# Patient Record
Sex: Male | Born: 1980
Health system: Southern US, Community
[De-identification: ages and names within clinical notes are randomized; demographics above are authoritative.]

## PROBLEM LIST (undated history)

## (undated) DIAGNOSIS — N289 Disorder of kidney and ureter, unspecified: Secondary | ICD-10-CM

## (undated) DIAGNOSIS — K219 Gastro-esophageal reflux disease without esophagitis: Secondary | ICD-10-CM

## (undated) DIAGNOSIS — Z9484 Stem cells transplant status: Secondary | ICD-10-CM

## (undated) DIAGNOSIS — C8190 Hodgkin lymphoma, unspecified, unspecified site: Secondary | ICD-10-CM

## (undated) DIAGNOSIS — J302 Other seasonal allergic rhinitis: Secondary | ICD-10-CM

## (undated) HISTORY — DX: Gastro-esophageal reflux disease without esophagitis: K21.9

## (undated) HISTORY — DX: Other seasonal allergic rhinitis: J30.2

## (undated) HISTORY — PX: LIMBAL STEM CELL TRANSPLANT: SHX1969

## (undated) HISTORY — DX: Disorder of kidney and ureter, unspecified: N28.9

## (undated) HISTORY — DX: Hodgkin lymphoma, unspecified, unspecified site: C81.90

## (undated) HISTORY — DX: Stem cells transplant status: Z94.84

---

## 2003-09-20 ENCOUNTER — Other Ambulatory Visit: Admission: RE | Admit: 2003-09-20 | Discharge: 2003-09-20 | Payer: Self-pay | Admitting: Vascular Surgery

## 2003-10-08 ENCOUNTER — Ambulatory Visit (HOSPITAL_COMMUNITY): Admission: RE | Admit: 2003-10-08 | Discharge: 2003-10-08 | Payer: Self-pay | Admitting: Oncology

## 2003-12-08 ENCOUNTER — Ambulatory Visit: Payer: Self-pay | Admitting: Oncology

## 2004-02-01 ENCOUNTER — Ambulatory Visit: Payer: Self-pay | Admitting: Oncology

## 2004-04-10 ENCOUNTER — Ambulatory Visit: Payer: Self-pay | Admitting: Oncology

## 2004-04-14 ENCOUNTER — Ambulatory Visit (HOSPITAL_COMMUNITY): Admission: RE | Admit: 2004-04-14 | Discharge: 2004-04-14 | Payer: Self-pay | Admitting: Oncology

## 2004-07-03 ENCOUNTER — Ambulatory Visit: Payer: Self-pay | Admitting: Oncology

## 2004-11-29 ENCOUNTER — Ambulatory Visit: Payer: Self-pay | Admitting: Oncology

## 2005-02-21 ENCOUNTER — Ambulatory Visit: Payer: Self-pay | Admitting: Oncology

## 2005-05-24 ENCOUNTER — Ambulatory Visit: Payer: Self-pay | Admitting: Oncology

## 2005-08-16 ENCOUNTER — Ambulatory Visit: Payer: Self-pay | Admitting: Oncology

## 2010-12-28 DIAGNOSIS — Z9484 Stem cells transplant status: Secondary | ICD-10-CM | POA: Insufficient documentation

## 2012-12-11 DIAGNOSIS — C8190 Hodgkin lymphoma, unspecified, unspecified site: Secondary | ICD-10-CM | POA: Insufficient documentation

## 2017-07-31 ENCOUNTER — Encounter: Payer: Self-pay | Admitting: Family Medicine

## 2017-07-31 ENCOUNTER — Ambulatory Visit (INDEPENDENT_AMBULATORY_CARE_PROVIDER_SITE_OTHER): Payer: 59 | Admitting: Family Medicine

## 2017-07-31 VITALS — BP 110/82 | HR 72 | Temp 98.2°F | Wt 192.2 lb

## 2017-07-31 DIAGNOSIS — Z13220 Encounter for screening for lipoid disorders: Secondary | ICD-10-CM | POA: Diagnosis not present

## 2017-07-31 DIAGNOSIS — F17210 Nicotine dependence, cigarettes, uncomplicated: Secondary | ICD-10-CM | POA: Diagnosis not present

## 2017-07-31 DIAGNOSIS — Z7251 High risk heterosexual behavior: Secondary | ICD-10-CM

## 2017-07-31 DIAGNOSIS — Z0000 Encounter for general adult medical examination without abnormal findings: Secondary | ICD-10-CM | POA: Diagnosis not present

## 2017-07-31 DIAGNOSIS — C8190 Hodgkin lymphoma, unspecified, unspecified site: Secondary | ICD-10-CM | POA: Diagnosis not present

## 2017-07-31 LAB — LIPID PANEL
CHOLESTEROL: 190 mg/dL (ref 0–200)
HDL: 38.9 mg/dL — ABNORMAL LOW (ref >39.00)
LDL Cholesterol: 131 mg/dL — ABNORMAL HIGH (ref 0–99)
NonHDL: 150.62
TRIGLYCERIDES: 99 mg/dL (ref 0.0–149.0)
Total CHOL/HDL Ratio: 5
VLDL: 19.8 mg/dL (ref 0.0–40.0)

## 2017-07-31 LAB — COMPREHENSIVE METABOLIC PANEL
ALBUMIN: 4.6 g/dL (ref 3.5–5.2)
ALK PHOS: 38 U/L — AB (ref 39–117)
ALT: 15 U/L (ref 0–53)
AST: 16 U/L (ref 0–37)
BILIRUBIN TOTAL: 0.8 mg/dL (ref 0.2–1.2)
BUN: 20 mg/dL (ref 6–23)
CALCIUM: 10.3 mg/dL (ref 8.4–10.5)
CO2: 28 mEq/L (ref 19–32)
Chloride: 104 mEq/L (ref 96–112)
Creatinine, Ser: 1.21 mg/dL (ref 0.40–1.50)
GFR: 71.02 mL/min (ref >60.00)
Glucose, Bld: 96 mg/dL (ref 70–99)
Potassium: 3.9 mEq/L (ref 3.5–5.1)
Sodium: 138 mEq/L (ref 135–145)
TOTAL PROTEIN: 6.6 g/dL (ref 6.0–8.3)

## 2017-07-31 LAB — CBC WITH DIFFERENTIAL/PLATELET
BASOS ABS: 0 10*3/uL (ref 0.0–0.1)
Basophils Relative: 0.3 % (ref 0.0–3.0)
EOS ABS: 0.1 10*3/uL (ref 0.0–0.7)
Eosinophils Relative: 1.1 % (ref 0.0–5.0)
HEMATOCRIT: 41.9 % (ref 39.0–52.0)
HEMOGLOBIN: 14.3 g/dL (ref 13.0–17.0)
LYMPHS PCT: 19.5 % (ref 12.0–46.0)
Lymphs Abs: 0.9 10*3/uL (ref 0.7–4.0)
MCHC: 34 g/dL (ref 30.0–36.0)
MCV: 89 fl (ref 78.0–100.0)
Monocytes Absolute: 0.4 10*3/uL (ref 0.1–1.0)
Monocytes Relative: 9.3 % (ref 3.0–12.0)
Neutro Abs: 3.2 10*3/uL (ref 1.4–7.7)
Neutrophils Relative %: 69.8 % (ref 43.0–77.0)
Platelets: 212 10*3/uL (ref 150.0–400.0)
RBC: 4.72 Mil/uL (ref 4.22–5.81)
RDW: 13.1 % (ref 11.5–15.5)
WBC: 4.6 10*3/uL (ref 4.0–10.5)

## 2017-07-31 LAB — TSH: TSH: 1.89 u[IU]/mL (ref 0.35–4.50)

## 2017-07-31 MED ORDER — FLUTICASONE PROPIONATE 50 MCG/ACT NA SUSP: 2.0000 | Freq: Every day | NASAL | 6 refills | Status: DC

## 2017-07-31 MED ORDER — BUPROPION HCL ER (SR) 150 MG PO TB12: 150.0000 mg | ORAL_TABLET | Freq: Two times a day (BID) | ORAL | 1 refills | Status: DC

## 2017-07-31 MED ORDER — RANITIDINE HCL 150 MG PO TABS: 150.0000 mg | ORAL_TABLET | Freq: Two times a day (BID) | ORAL | 2 refills | Status: DC

## 2017-07-31 MED ORDER — FEXOFENADINE HCL 180 MG PO TABS: 180.0000 mg | ORAL_TABLET | Freq: Every day | ORAL | 2 refills | Status: DC

## 2017-07-31 MED FILL — BUPROPION SR 150 MG TABLET: 150 | 30 days supply | Qty: 60 | Fill #0

## 2017-07-31 MED FILL — raNITIdine HCL 150 MG TABS: 150 | 90 days supply | Qty: 180 | Fill #0

## 2017-07-31 MED FILL — FLUTICASONE PROP 50 MCG SPR: 50 | 30 days supply | Qty: 16 | Fill #0

## 2017-07-31 NOTE — Patient Instructions (Signed)

## 2017-07-31 NOTE — Assessment & Plan Note (Signed)
>  10 minutes spent counseling regarding smoking cessation Recommend quitting Would like to try medication to help with quitting, rx for bupropion.

## 2017-07-31 NOTE — Assessment & Plan Note (Signed)
Well adult Orders Placed This Encounter  Procedures  . GC/Chlamydia Probe Amp([facility])    Order Specific Question:   Source    Answer:   urine  . Comp Met (CMET)  . CBC w/Diff  . TSH  . Lipid Profile  . HIV antibody (with reflex)  . RPR   Screening:  STD screening ordered Immunizations: Up to date Anticipatory guidance/Risk factor reduction:  Per AVS

## 2017-07-31 NOTE — Assessment & Plan Note (Addendum)
Follows with [facility] yearly Tender area to R axilla, does not appear to be enlarged lymph node and improved from when he first noticed.  He will monitor.

## 2017-07-31 NOTE — Progress Notes (Signed)
NUMA SCHROETER - 37 y.o. male MRN [id number]  Date of birth: [birth year]  Subjective Chief Complaint  Patient presents with  . Establish Care    Est care,CPE with fasting    HPI DAWON TROOP is a 36 y.o. male with history of hodgkins lymphoma s/p stem cell transplant now in remission here today to establish care and for CPE.  He follows yearly with [facility] for history of lymphoma.  He reports he is doing well.  Would like to have fasting labs today as well as STD screening.  He remains active and follows a fairly healthy diet.  He is taking OTC medications for allergies and GERD.   Current daily smoker, interested in quitting.  Has tried patches, gum and lozenges without much success.   Review of Systems  Constitutional: Negative for chills, fever, malaise/fatigue and weight loss.  HENT: Negative for congestion, ear pain and sore throat.   Eyes: Negative for blurred vision, double vision and pain.  Respiratory: Negative for cough and shortness of breath.   Cardiovascular: Negative for chest pain and palpitations.  Gastrointestinal: Negative for abdominal pain, blood in stool, constipation, heartburn and nausea.  Genitourinary: Negative for dysuria and urgency.  Musculoskeletal: Negative for joint pain and myalgias.  Skin: Negative for itching and rash.  Neurological: Negative for dizziness and headaches.  Endo/Heme/Allergies: Does not bruise/bleed easily.       Painful enlarged area R underarm,?lymph node   Psychiatric/Behavioral: Negative for depression. The patient is not nervous/anxious and does not have insomnia.     No Known Allergies  Past Medical History:  Diagnosis Date  . GERD (gastroesophageal reflux disease)   . Hodgkin's lymphoma ([place])   . Seasonal allergies     Past Surgical History:  Procedure Laterality Date  . LIMBAL STEM CELL TRANSPLANT      Social History   Socioeconomic History  . Marital status: Married    Spouse name: Not on file  . Number  of children: Not on file  . Years of education: Not on file  . Highest education level: Not on file  Occupational History  . Not on file  Social Needs  . Financial resource strain: Not on file  . Food insecurity:    Worry: Not on file    Inability: Not on file  . Transportation needs:    Medical: Not on file    Non-medical: Not on file  Tobacco Use  . Smoking status: Current Every Day Smoker    Packs/day: 1.00    Types: Cigarettes  . Smokeless tobacco: Never Used  Substance and Sexual Activity  . Alcohol use: Yes    Comment: occasional  . Drug use: Never  . Sexual activity: Yes  Lifestyle  . Physical activity:    Days per week: Not on file    Minutes per session: Not on file  . Stress: Not on file  Relationships  . Social connections:    Talks on phone: Not on file    Gets together: Not on file    Attends religious service: Not on file    Active member of club or organization: Not on file    Attends meetings of clubs or organizations: Not on file    Relationship status: Not on file  Other Topics Concern  . Not on file  Social History Narrative  . Not on file    History reviewed. No pertinent family history.  Health Maintenance  Topic Date Due  . HIV Screening  09/30/1993  . INFLUENZA VACCINE  08/29/2017  . TETANUS/TDAP  12/09/2021    ----------------------------------------------------------------------------------------------------------------------------------------------------------------------------------------------------------------- Physical Exam BP 110/82 (BP Location: Left Arm, Patient Position: Sitting, Cuff Size: Normal)   Pulse 72   Temp 98.2 F (36.8 C) (Oral)   Wt 192 lb 3.2 oz (87.2 kg)   SpO2 96%   Physical Exam  Constitutional: He is oriented to person, place, and time. He appears well-nourished. No distress.  HENT:  Head: Normocephalic and atraumatic.  Mouth/Throat: Oropharynx is clear and moist.  Serous fluid behind R TM L TM  normal.   Eyes: No scleral icterus.  Neck: Neck supple. No thyromegaly present.  Cardiovascular: Normal rate, regular rhythm and normal heart sounds.  Pulmonary/Chest: Effort normal and breath sounds normal.  Abdominal: Soft. Bowel sounds are normal.  Musculoskeletal: He exhibits no edema.  Lymphadenopathy:    He has no cervical adenopathy.       Right: No inguinal and no supraclavicular adenopathy present.       Left: No inguinal and no supraclavicular adenopathy present.  Small palpable area R underarm, tender.    Neurological: He is alert and oriented to person, place, and time. No cranial nerve deficit. Coordination normal.  Skin: Skin is warm and dry. No rash noted.  Psychiatric: He has a normal mood and affect. His behavior is normal.    ------------------------------------------------------------------------------------------------------------------------------------------------------------------------------------------------------------------- Assessment and Plan  Hodgkin's disease ([place]) Follows with [facility] yearly Tender area to R axilla, does not appear to be enlarged lymph node and improved from when he first noticed.  He will monitor.    Well adult exam Well adult Orders Placed This Encounter  Procedures  . GC/Chlamydia Probe Amp([facility])    Order Specific Question:   Source    Answer:   urine  . Comp Met (CMET)  . CBC w/Diff  . TSH  . Lipid Profile  . HIV antibody (with reflex)  . RPR   Screening:  STD screening ordered Immunizations: Up to date Anticipatory guidance/Risk factor reduction:  Per AVS  Nicotine dependence, cigarettes, uncomplicated >93 minutes spent counseling regarding smoking cessation Recommend quitting Would like to try medication to help with quitting, rx for bupropion.

## 2017-08-01 LAB — GC/CHLAMYDIA PROBE AMP
Chlamydia trachomatis, NAA: NEGATIVE
Neisseria gonorrhoeae by PCR: NEGATIVE

## 2017-08-02 ENCOUNTER — Encounter: Payer: Self-pay | Admitting: Family Medicine

## 2017-08-02 LAB — RPR: RPR: NONREACTIVE

## 2017-08-02 LAB — HIV ANTIBODY (ROUTINE TESTING W REFLEX): HIV 1&2 Ab, 4th Generation: NONREACTIVE

## 2017-08-05 NOTE — Telephone Encounter (Signed)
Patient called and is inquring about is lab results.  CB# [phone]

## 2017-08-05 NOTE — Telephone Encounter (Signed)
Result note sent via mychart, some results were delayed (rpr, gc/chlamydia) due to the holiday.

## 2017-08-13 DIAGNOSIS — H52223 Regular astigmatism, bilateral: Secondary | ICD-10-CM | POA: Diagnosis not present

## 2017-08-13 DIAGNOSIS — H5213 Myopia, bilateral: Secondary | ICD-10-CM | POA: Diagnosis not present

## 2017-08-23 DIAGNOSIS — F4322 Adjustment disorder with anxiety: Secondary | ICD-10-CM | POA: Diagnosis not present

## 2017-09-05 ENCOUNTER — Encounter: Payer: Self-pay | Admitting: Family Medicine

## 2017-09-05 ENCOUNTER — Ambulatory Visit: Payer: 59 | Admitting: Family Medicine

## 2017-09-05 VITALS — BP 128/80 | HR 72 | Temp 98.5°F | Ht 73.0 in | Wt 189.4 lb

## 2017-09-05 DIAGNOSIS — R222 Localized swelling, mass and lump, trunk: Secondary | ICD-10-CM | POA: Insufficient documentation

## 2017-09-05 NOTE — Patient Instructions (Signed)
Lipoma A lipoma is a noncancerous (benign) tumor that is made up of fat cells. This is a very common type of soft-tissue growth. Lipomas are usually found under the skin (subcutaneous). They may occur in any tissue of the body that contains fat. Common areas for lipomas to appear include the back, shoulders, buttocks, and thighs. Lipomas grow slowly, and they are usually painless. Most lipomas do not cause problems and do not require treatment. What are the causes? The cause of this condition is not known. What increases the risk? This condition is more likely to develop in:  People who are 40-60 years old.  People who have a family history of lipomas.  What are the signs or symptoms? A lipoma usually appears as a small, round bump under the skin. It may feel soft or rubbery, but the firmness can vary. Most lipomas are not painful. However, a lipoma may become painful if it is located in an area where it pushes on nerves. How is this diagnosed? A lipoma can usually be diagnosed with a physical exam. You may also have tests to confirm the diagnosis and to rule out other conditions. Tests may include:  Imaging tests, such as a CT scan or MRI.  Removal of a tissue sample to be looked at under a microscope (biopsy).  How is this treated? Treatment is not needed for small lipomas that are not causing problems. If a lipoma continues to get bigger or it causes problems, removal is often the best option. Lipomas can also be removed to improve appearance. Removal of a lipoma is usually done with a surgery in which the fatty cells and the surrounding capsule are removed. Most often, a medicine that numbs the area (local anesthetic) is used for this procedure. Follow these instructions at home:  Keep all follow-up visits as directed by your health care provider. This is important. Contact a health care provider if:  Your lipoma becomes larger or hard.  Your lipoma becomes painful, red, or  increasingly swollen. These could be signs of infection or a more serious condition. This information is not intended to replace advice given to you by your health care provider. Make sure you discuss any questions you have with your health care provider. Document Released: 01/05/2002 Document Revised: 06/23/2015 Document Reviewed: 01/11/2014 Elsevier Interactive Patient Education  2018 Elsevier Inc.  

## 2017-09-05 NOTE — Progress Notes (Signed)
Subjective:  Patient ID: Gregory Holmes, male    DOB: [birth year]  Age: 37 y.o. MRN: [id number]  CC: Mass (right side of back)   HPI Gregory Holmes presents for evaluation of a mass that he discovered in his right posterior rib cage a few days ago.  Mass is nontender and has not grown.  Has no history of inclusion cyst.  Significant past medical history of Hoskins lymphoma in remission.  Has no history of lipoma  Outpatient Medications Prior to Visit  Medication Sig Dispense Refill  . fexofenadine (ALLEGRA) 180 MG tablet Take 1 tablet (180 mg total) by mouth daily. 90 tablet 2  . fluticasone (FLONASE) 50 MCG/ACT nasal spray Place 2 sprays into both nostrils daily. 16 g 6  . ranitidine (ZANTAC) 150 MG tablet Take 1 tablet (150 mg total) by mouth 2 (two) times daily. 180 tablet 2  . buPROPion (WELLBUTRIN SR) 150 MG 12 hr tablet Take 1 tablet (150 mg total) by mouth 2 (two) times daily. One daily x3 days then increase to BID 60 tablet 1   No facility-administered medications prior to visit.     ROS Review of Systems  Constitutional: Negative for chills, diaphoresis, fever and unexpected weight change.  Respiratory: Negative.   Cardiovascular: Negative.   Gastrointestinal: Negative.   Skin: Negative for color change, pallor, rash and wound.  Hematological: Negative for adenopathy. Does not bruise/bleed easily.  Psychiatric/Behavioral: Negative.     Objective:  BP 128/80   Pulse 72   Temp 98.5 F (36.9 C)   Ht 6\' 1"  (1.854 m)   Wt 189 lb 6 oz (85.9 kg)   SpO2 97%   BMI 24.99 kg/m   BP Readings from Last 3 Encounters:  09/05/17 128/80  07/31/17 110/82    Wt Readings from Last 3 Encounters:  09/05/17 189 lb 6 oz (85.9 kg)  07/31/17 192 lb 3.2 oz (87.2 kg)    Physical Exam  Constitutional: He is oriented to person, place, and time. He appears well-developed and well-nourished. No distress.  HENT:  Head: Normocephalic and atraumatic.  Right Ear: External ear normal.    Left Ear: External ear normal.  Mouth/Throat: Oropharynx is clear and moist. No oropharyngeal exudate.  Eyes: Pupils are equal, round, and reactive to light. Conjunctivae and EOM are normal. Right eye exhibits no discharge. Left eye exhibits no discharge. No scleral icterus.  Neck: No JVD present. No tracheal deviation present. No thyromegaly present.  Cardiovascular: Normal rate, regular rhythm and normal heart sounds.  Pulmonary/Chest: Effort normal and breath sounds normal.  Lymphadenopathy:    He has no cervical adenopathy.  Neurological: He is alert and oriented to person, place, and time.  Skin: Skin is warm and dry. He is not diaphoretic.     Psychiatric: He has a normal mood and affect. His behavior is normal.    Lab Results  Component Value Date   WBC 4.6 07/31/2017   HGB 14.3 07/31/2017   HCT 41.9 07/31/2017   PLT 212.0 07/31/2017   GLUCOSE 96 07/31/2017   CHOL 190 07/31/2017   TRIG 99.0 07/31/2017   HDL 38.90 (L) 07/31/2017   LDLCALC 131 (H) 07/31/2017   ALT 15 07/31/2017   AST 16 07/31/2017   NA 138 07/31/2017   K 3.9 07/31/2017   CL 104 07/31/2017   CREATININE 1.21 07/31/2017   BUN 20 07/31/2017   CO2 28 07/31/2017   TSH 1.89 07/31/2017    No results found.  Assessment &  Plan:   Tyren was seen today for mass.  Diagnoses and all orders for this visit:  Mass in chest   I have discontinued Mills C. Davisson's buPROPion. I am also having him maintain his fexofenadine, fluticasone, and ranitidine.  No orders of the defined types were placed in this encounter.  Believe that mass in question is likely to be a lipoma.  Explained the nature of these lesions to the patient.  He was given information about lipomas.  Of course he will return if the mass grows becomes tender or inflamed.  Reassurance was offered and that lymph tissue is not found in the area of concern.  Follow-up: Return if symptoms worsen or fail to improve.  Libby Maw, MD

## 2017-09-09 DIAGNOSIS — F4322 Adjustment disorder with anxiety: Secondary | ICD-10-CM | POA: Diagnosis not present

## 2017-10-04 DIAGNOSIS — F4322 Adjustment disorder with anxiety: Secondary | ICD-10-CM | POA: Diagnosis not present

## 2017-12-05 DIAGNOSIS — H9203 Otalgia, bilateral: Secondary | ICD-10-CM | POA: Diagnosis not present

## 2017-12-05 DIAGNOSIS — H903 Sensorineural hearing loss, bilateral: Secondary | ICD-10-CM | POA: Diagnosis not present

## 2017-12-05 DIAGNOSIS — H9313 Tinnitus, bilateral: Secondary | ICD-10-CM | POA: Diagnosis not present

## 2017-12-09 DIAGNOSIS — F4310 Post-traumatic stress disorder, unspecified: Secondary | ICD-10-CM | POA: Diagnosis not present

## 2017-12-09 DIAGNOSIS — H903 Sensorineural hearing loss, bilateral: Secondary | ICD-10-CM | POA: Diagnosis not present

## 2017-12-16 DIAGNOSIS — H903 Sensorineural hearing loss, bilateral: Secondary | ICD-10-CM | POA: Diagnosis not present

## 2017-12-17 DIAGNOSIS — F4310 Post-traumatic stress disorder, unspecified: Secondary | ICD-10-CM | POA: Diagnosis not present

## 2017-12-18 DIAGNOSIS — F4323 Adjustment disorder with mixed anxiety and depressed mood: Secondary | ICD-10-CM | POA: Diagnosis not present

## 2017-12-19 ENCOUNTER — Encounter: Payer: Self-pay | Admitting: Family Medicine

## 2017-12-19 MED FILL — FLUTICASONE PROP 50 MCG SPR: 50 | 30 days supply | Qty: 16 | Fill #0

## 2017-12-20 ENCOUNTER — Other Ambulatory Visit: Payer: Self-pay | Admitting: Family Medicine

## 2017-12-20 ENCOUNTER — Telehealth: Payer: Self-pay

## 2017-12-20 MED ORDER — LEVOCETIRIZINE DIHYDROCHLORIDE 5 MG PO TABS: 5.0000 mg | ORAL_TABLET | Freq: Every evening | ORAL | 6 refills | Status: DC

## 2017-12-20 MED ORDER — FLUTICASONE PROPIONATE 50 MCG/ACT NA SUSP: 2.0000 | Freq: Every day | NASAL | 6 refills | Status: AC

## 2017-12-20 MED ORDER — FAMOTIDINE 20 MG PO TABS: 20.0000 mg | ORAL_TABLET | Freq: Two times a day (BID) | ORAL | 3 refills | Status: DC

## 2017-12-20 MED FILL — FAMOTIDINE 20 MG TABLET: 20 | 30 days supply | Qty: 60 | Fill #0

## 2017-12-20 MED FILL — LEVOCETIRIZINE 5 MG TABLET: 5 | 30 days supply | Qty: 30 | Fill #0

## 2017-12-20 NOTE — Telephone Encounter (Signed)
Medication has been sent to the pharmacy. 

## 2017-12-20 NOTE — Telephone Encounter (Signed)
Rx for xyzal, famotidine and flonase sent in.

## 2017-12-20 NOTE — Telephone Encounter (Signed)
Copied from [place] [id number]. Topic: General - Other >> Dec 19, 2017  5:05 PM Keene Breath wrote: Reason for CRM: Patient called to inform the doctor of a recall for his medication, ranitidine (ZANTAC) 150 MG tablet.  Patient would like an alternative medication.  Please advise and CB at [phone].

## 2017-12-30 ENCOUNTER — Ambulatory Visit: Payer: 59 | Admitting: Family Medicine

## 2017-12-30 ENCOUNTER — Encounter: Payer: Self-pay | Admitting: Family Medicine

## 2017-12-30 VITALS — BP 132/78 | HR 82 | Temp 98.4°F | Ht 73.0 in | Wt 191.0 lb

## 2017-12-30 DIAGNOSIS — F418 Other specified anxiety disorders: Secondary | ICD-10-CM | POA: Diagnosis not present

## 2017-12-30 DIAGNOSIS — F4310 Post-traumatic stress disorder, unspecified: Secondary | ICD-10-CM | POA: Diagnosis not present

## 2017-12-30 MED ORDER — ESCITALOPRAM OXALATE 10 MG PO TABS: 10.0000 mg | ORAL_TABLET | Freq: Every day | ORAL | 1 refills | Status: DC

## 2017-12-30 MED FILL — ESCITALOPRAM 10 MG TABLET: 10 | 30 days supply | Qty: 30 | Fill #0

## 2017-12-30 NOTE — Assessment & Plan Note (Signed)
-  We discussed medication treatment options in addition to his current therapy.  -Will start lexapro 10mg  -Follow up in 2 weeks.   >25 minutes spent with patient with 50% of time spent providing counseling and coordinating plan of care.

## 2017-12-30 NOTE — Progress Notes (Signed)
Gregory Holmes - 37 y.o. male MRN [id number]  Date of birth: 17-Apr-1980  Subjective Chief Complaint  Patient presents with  . Depression    Wife encouraged eval due to ongoing loss of interest, mood changes    HPI Gregory Holmes is a 37 y.o. male here today to discuss depression and anxiety.  Reports symptoms of depressed mood and anxious feeling with anhedonia.  He reports that feelings started several months ago.  Reports having an affair a few months ago and once this came out he began to have increased symptoms.  He reports that affair is over at this point.  He was drinking excess EtOH at one point to deal with symptoms but has now cut back to only 3-4 drinks per week.  He is seeing a individual therapist as well as a marriage Social worker.  This has been somewhat helpful.  He did try wellbutrin to help with quitting smoking previously as well but this didn't seem to do a whole lot for mood or anxiety.  He is interested in trying something else.  He denies SI/HI.   Depression screen [facility] 2/9 12/30/2017 07/31/2017  Decreased Interest 2 0  Down, Depressed, Hopeless 3 0  PHQ - 2 Score 5 0  Altered sleeping 1 -  Tired, decreased energy 3 -  Change in appetite 2 -  Feeling bad or failure about yourself  3 -  Trouble concentrating 2 -  Moving slowly or fidgety/restless 2 -  Suicidal thoughts 0 -  PHQ-9 Score 18 -     GAD 7 : Generalized Anxiety Score 12/30/2017  Nervous, Anxious, on Edge 3  Control/stop worrying 2  Worry too much - different things 2  Trouble relaxing 2  Restless 2  Easily annoyed or irritable 2  Afraid - awful might happen 2  Total GAD 7 Score 15    ROS:  A comprehensive ROS was completed and negative except as noted per HPI  No Known Allergies  Past Medical History:  Diagnosis Date  . GERD (gastroesophageal reflux disease)   . Hodgkin's lymphoma ([place])   . Seasonal allergies     Past Surgical History:  Procedure Laterality Date  . LIMBAL STEM CELL  TRANSPLANT      Social History   Socioeconomic History  . Marital status: Married    Spouse name: Not on file  . Number of children: Not on file  . Years of education: Not on file  . Highest education level: Not on file  Occupational History  . Not on file  Social Needs  . Financial resource strain: Not on file  . Food insecurity:    Worry: Not on file    Inability: Not on file  . Transportation needs:    Medical: Not on file    Non-medical: Not on file  Tobacco Use  . Smoking status: Current Every Day Smoker    Packs/day: 1.00    Types: Cigarettes  . Smokeless tobacco: Never Used  Substance and Sexual Activity  . Alcohol use: Yes    Comment: occasional  . Drug use: Never  . Sexual activity: Yes  Lifestyle  . Physical activity:    Days per week: Not on file    Minutes per session: Not on file  . Stress: Not on file  Relationships  . Social connections:    Talks on phone: Not on file    Gets together: Not on file    Attends religious service: Not on file  Active member of club or organization: Not on file    Attends meetings of clubs or organizations: Not on file    Relationship status: Not on file  Other Topics Concern  . Not on file  Social History Narrative  . Not on file    Family History  Problem Relation Age of Onset  . Diabetes Mother     Health Maintenance  Topic Date Due  . Samul Dada  12/09/2021  . INFLUENZA VACCINE  Completed  . HIV Screening  Completed    ----------------------------------------------------------------------------------------------------------------------------------------------------------------------------------------------------------------- Physical Exam BP 132/78   Pulse 82   Temp 98.4 F (36.9 C) (Oral)   Ht 6\' 1"  (1.854 m)   Wt 191 lb (86.6 kg)   SpO2 98%   BMI 25.20 kg/m   Physical Exam  Constitutional: He is oriented to person, place, and time. He appears well-nourished. No distress.  HENT:  Head:  Normocephalic and atraumatic.  Neurological: He is alert and oriented to person, place, and time.  Skin: Skin is warm and dry.  Psychiatric: He has a normal mood and affect. His behavior is normal. Judgment and thought content normal.    ------------------------------------------------------------------------------------------------------------------------------------------------------------------------------------------------------------------- Assessment and Plan  Depression with anxiety -We discussed medication treatment options in addition to his current therapy.  -Will start lexapro 10mg  -Follow up in 2 weeks.   >25 minutes spent with patient with 50% of time spent providing counseling and coordinating plan of care.

## 2017-12-30 NOTE — Patient Instructions (Signed)
Living With Depression Everyone experiences occasional disappointment, sadness, and loss in their lives. When you are feeling down, blue, or sad for at least 2 weeks in a row, it may mean that you have depression. Depression can affect your thoughts and feelings, relationships, daily activities, and physical health. It is caused by changes in the way your brain functions. If you receive a diagnosis of depression, your health care provider will tell you which type of depression you have and what treatment options are available to you. If you are living with depression, there are ways to help you recover from it and also ways to prevent it from coming back. How to cope with lifestyle changes Coping with stress Stress is your body's reaction to life changes and events, both good and bad. Stressful situations may include:  Getting married.  The death of a spouse.  Losing a job.  Retiring.  Having a baby.  Stress can last just a few hours or it can be ongoing. Stress can play a major role in depression, so it is important to learn both how to cope with stress and how to think about it differently. Talk with your health care provider or a counselor if you would like to learn more about stress reduction. He or she may suggest some stress reduction techniques, such as:  Music therapy. This can include creating music or listening to music. Choose music that you enjoy and that inspires you.  Mindfulness-based meditation. This kind of meditation can be done while sitting or walking. It involves being aware of your normal breaths, rather than trying to control your breathing.  Centering prayer. This is a kind of meditation that involves focusing on a spiritual word or phrase. Choose a word, phrase, or sacred image that is meaningful to you and that brings you peace.  Deep breathing. To do this, expand your stomach and inhale slowly through your nose. Hold your breath for 3-5 seconds, then exhale  slowly, allowing your stomach muscles to relax.  Muscle relaxation. This involves intentionally tensing muscles then relaxing them.  Choose a stress reduction technique that fits your lifestyle and personality. Stress reduction techniques take time and practice to develop. Set aside 5-15 minutes a day to do them. Therapists can offer training in these techniques. The training may be covered by some insurance plans. Other things you can do to manage stress include:  Keeping a stress diary. This can help you learn what triggers your stress and ways to control your response.  Understanding what your limits are and saying no to requests or events that lead to a schedule that is too full.  Thinking about how you respond to certain situations. You may not be able to control everything, but you can control how you react.  Adding humor to your life by watching funny films or TV shows.  Making time for activities that help you relax and not feeling guilty about spending your time this way.  Medicines Your health care provider may suggest certain medicines if he or she feels that they will help improve your condition. Avoid using alcohol and other substances that may prevent your medicines from working properly (may interact). It is also important to:  Talk with your pharmacist or health care provider about all the medicines that you take, their possible side effects, and what medicines are safe to take together.  Make it your goal to take part in all treatment decisions (shared decision-making). This includes giving input on the side   effects of medicines. It is best if shared decision-making with your health care provider is part of your total treatment plan.  If your health care provider prescribes a medicine, you may not notice the full benefits of it for 4-8 weeks. Most people who are treated for depression need to be on medicine for at least 6-12 months after they feel better. If you are taking  medicines as part of your treatment, do not stop taking medicines without first talking to your health care provider. You may need to have the medicine slowly decreased (tapered) over time to decrease the risk of harmful side effects. Relationships Your health care provider may suggest family therapy along with individual therapy and drug therapy. While there may not be family problems that are causing you to feel depressed, it is still important to make sure your family learns as much as they can about your mental health. Having your family's support can help make your treatment successful. How to recognize changes in your condition Everyone has a different response to treatment for depression. Recovery from major depression happens when you have not had signs of major depression for two months. This may mean that you will start to:  Have more interest in doing activities.  Feel less hopeless than you did 2 months ago.  Have more energy.  Overeat less often, or have better or improving appetite.  Have better concentration.  Your health care provider will work with you to decide the next steps in your recovery. It is also important to recognize when your condition is getting worse. Watch for these signs:  Having fatigue or low energy.  Eating too much or too little.  Sleeping too much or too little.  Feeling restless, agitated, or hopeless.  Having trouble concentrating or making decisions.  Having unexplained physical complaints.  Feeling irritable, angry, or aggressive.  Get help as soon as you or your family members notice these symptoms coming back. How to get support and help from others How to talk with friends and family members about your condition Talking to friends and family members about your condition can provide you with one way to get support and guidance. Reach out to trusted friends or family members, explain your symptoms to them, and let them know that you are  working with a health care provider to treat your depression. Financial resources Not all insurance plans cover mental health care, so it is important to check with your insurance carrier. If paying for co-pays or counseling services is a problem, search for a local or county mental health care center. They may be able to offer public mental health care services at low or no cost when you are not able to see a private health care provider. If you are taking medicine for depression, you may be able to get the generic form, which may be less expensive. Some makers of prescription medicines also offer help to patients who cannot afford the medicines they need. Follow these instructions at home:  Get the right amount and quality of sleep.  Cut down on using caffeine, tobacco, alcohol, and other potentially harmful substances.  Try to exercise, such as walking or lifting small weights.  Take over-the-counter and prescription medicines only as told by your health care provider.  Eat a healthy diet that includes plenty of vegetables, fruits, whole grains, low-fat dairy products, and lean protein. Do not eat a lot of foods that are high in solid fats, added sugars, or salt.    Keep all follow-up visits as told by your health care provider. This is important. Contact a health care provider if:  You stop taking your antidepressant medicines, and you have any of these symptoms: ? Nausea. ? Headache. ? Feeling lightheaded. ? Chills and body aches. ? Not being able to sleep (insomnia).  You or your friends and family think your depression is getting worse. Get help right away if:  You have thoughts of hurting yourself or others. If you ever feel like you may hurt yourself or others, or have thoughts about taking your own life, get help right away. You can go to your nearest emergency department or call:  Your local emergency services (911 in the [place]  A suicide crisis helpline, such as the  National Suicide Prevention Lifeline at 1-800-273-8255. This is open 24-hours a day.  Summary  If you are living with depression, there are ways to help you recover from it and also ways to prevent it from coming back.  Work with your health care team to create a management plan that includes counseling, stress management techniques, and healthy lifestyle habits. This information is not intended to replace advice given to you by your health care provider. Make sure you discuss any questions you have with your health care provider. Document Released: 12/19/2015 Document Revised: 12/19/2015 Document Reviewed: 12/19/2015 Elsevier Interactive Patient Education  2018 [employer]  

## 2018-01-02 DIAGNOSIS — F4323 Adjustment disorder with mixed anxiety and depressed mood: Secondary | ICD-10-CM | POA: Diagnosis not present

## 2018-01-10 DIAGNOSIS — F4310 Post-traumatic stress disorder, unspecified: Secondary | ICD-10-CM | POA: Diagnosis not present

## 2018-01-10 DIAGNOSIS — F4323 Adjustment disorder with mixed anxiety and depressed mood: Secondary | ICD-10-CM | POA: Diagnosis not present

## 2018-01-16 ENCOUNTER — Encounter: Payer: Self-pay | Admitting: Family Medicine

## 2018-01-16 ENCOUNTER — Ambulatory Visit: Payer: 59 | Admitting: Family Medicine

## 2018-01-16 DIAGNOSIS — F418 Other specified anxiety disorders: Secondary | ICD-10-CM

## 2018-01-16 NOTE — Assessment & Plan Note (Signed)
-  Stable with some improvement since starting lexapro. -Tolerating well, continue at current dose -F/u 4-6 weeks.

## 2018-01-16 NOTE — Progress Notes (Signed)
Gregory Holmes - 37 y.o. male MRN [id number]  Date of birth: 1980-05-29  Subjective Chief Complaint  Patient presents with  . Follow-up    HPI Gregory Holmes is a 37 y.o. male here today for follow up of anxiety.  He was started on lexapro at recent appt 2 weeks ago.  He reports he is doing well with medication.  He has started to notice some benefits from medication.  He has had a little bit of dry mouth since starting but is otherwise tolerating well.  He continues to see a therapist and reports that this is going fairly well for him.  He has no additional concerns today.   ROS:  A comprehensive ROS was completed and negative except as noted per HPI No Known Allergies  Past Medical History:  Diagnosis Date  . GERD (gastroesophageal reflux disease)   . Hodgkin's lymphoma ([place])   . Seasonal allergies     Past Surgical History:  Procedure Laterality Date  . LIMBAL STEM CELL TRANSPLANT      Social History   Socioeconomic History  . Marital status: Married    Spouse name: Not on file  . Number of children: Not on file  . Years of education: Not on file  . Highest education level: Not on file  Occupational History  . Not on file  Social Needs  . Financial resource strain: Not on file  . Food insecurity:    Worry: Not on file    Inability: Not on file  . Transportation needs:    Medical: Not on file    Non-medical: Not on file  Tobacco Use  . Smoking status: Current Every Day Smoker    Packs/day: 1.00    Types: Cigarettes  . Smokeless tobacco: Never Used  Substance and Sexual Activity  . Alcohol use: Yes    Comment: occasional  . Drug use: Never  . Sexual activity: Yes  Lifestyle  . Physical activity:    Days per week: Not on file    Minutes per session: Not on file  . Stress: Not on file  Relationships  . Social connections:    Talks on phone: Not on file    Gets together: Not on file    Attends religious service: Not on file    Active member of club or  organization: Not on file    Attends meetings of clubs or organizations: Not on file    Relationship status: Not on file  Other Topics Concern  . Not on file  Social History Narrative  . Not on file    Family History  Problem Relation Age of Onset  . Diabetes Mother     Health Maintenance  Topic Date Due  . Samul Dada  12/09/2021  . INFLUENZA VACCINE  Completed  . HIV Screening  Completed    ----------------------------------------------------------------------------------------------------------------------------------------------------------------------------------------------------------------- Physical Exam BP 126/68   Pulse 83   Temp 98.1 F (36.7 C)   Ht 6\' 1"  (1.854 m)   Wt 191 lb (86.6 kg)   SpO2 98%   BMI 25.20 kg/m   Physical Exam Constitutional:      Appearance: Normal appearance.  HENT:     Head: Normocephalic and atraumatic.  Skin:    General: Skin is warm and dry.     Findings: No rash.  Neurological:     General: No focal deficit present.     Mental Status: He is alert.  Psychiatric:        Mood and  Affect: Mood normal.        Behavior: Behavior normal.        Thought Content: Thought content normal.        Judgment: Judgment normal.     ------------------------------------------------------------------------------------------------------------------------------------------------------------------------------------------------------------------- Assessment and Plan  Depression with anxiety -Stable with some improvement since starting lexapro. -Tolerating well, continue at current dose -F/u 4-6 weeks.

## 2018-01-17 DIAGNOSIS — F4323 Adjustment disorder with mixed anxiety and depressed mood: Secondary | ICD-10-CM | POA: Diagnosis not present

## 2018-01-23 DIAGNOSIS — F4323 Adjustment disorder with mixed anxiety and depressed mood: Secondary | ICD-10-CM | POA: Diagnosis not present

## 2018-01-30 MED FILL — ESCITALOPRAM 10 MG TABLET: 10 | 30 days supply | Qty: 30 | Fill #0

## 2018-02-10 DIAGNOSIS — F4310 Post-traumatic stress disorder, unspecified: Secondary | ICD-10-CM | POA: Diagnosis not present

## 2018-02-21 DIAGNOSIS — F4323 Adjustment disorder with mixed anxiety and depressed mood: Secondary | ICD-10-CM | POA: Diagnosis not present

## 2018-02-26 ENCOUNTER — Ambulatory Visit: Payer: 59 | Admitting: Family Medicine

## 2018-02-26 ENCOUNTER — Encounter: Payer: Self-pay | Admitting: Family Medicine

## 2018-02-26 DIAGNOSIS — F418 Other specified anxiety disorders: Secondary | ICD-10-CM

## 2018-02-26 MED ORDER — ESCITALOPRAM OXALATE 10 MG PO TABS: 10.0000 mg | ORAL_TABLET | Freq: Every day | ORAL | 1 refills | Status: DC

## 2018-02-26 MED FILL — ESCITALOPRAM 10 MG TABLET: 10 | 90 days supply | Qty: 90 | Fill #0

## 2018-02-26 NOTE — Assessment & Plan Note (Signed)
-  Doing well with lexapro, will continue. -F/u in 6 months.

## 2018-02-26 NOTE — Patient Instructions (Signed)
-  Great to see you today, glad you are doing well! -Continue current medication and see me again in 6 months.

## 2018-02-26 NOTE — Progress Notes (Signed)
Gregory Holmes - 38 y.o. male MRN [id number]  Date of birth: 1980-11-20  Subjective Chief Complaint  Patient presents with  . Follow-up    Doing well-denies concerns    HPI Gregory Holmes is a 38 y.o. male here today for follow up of depression and anxiety.  He has been treated with lexapro for about 2 months and reports he is doing well with current dose.  He denies side effects from current medication.  Anhedonia has improved and has not SI/HI.    ROS:  A comprehensive ROS was completed and negative except as noted per HPI   No Known Allergies  Past Medical History:  Diagnosis Date  . GERD (gastroesophageal reflux disease)   . Hodgkin's lymphoma ([place])   . Seasonal allergies     Past Surgical History:  Procedure Laterality Date  . LIMBAL STEM CELL TRANSPLANT      Social History   Socioeconomic History  . Marital status: Married    Spouse name: Not on file  . Number of children: Not on file  . Years of education: Not on file  . Highest education level: Not on file  Occupational History  . Not on file  Social Needs  . Financial resource strain: Not on file  . Food insecurity:    Worry: Not on file    Inability: Not on file  . Transportation needs:    Medical: Not on file    Non-medical: Not on file  Tobacco Use  . Smoking status: Current Every Day Smoker    Packs/day: 1.00    Types: Cigarettes  . Smokeless tobacco: Never Used  Substance and Sexual Activity  . Alcohol use: Yes    Comment: occasional  . Drug use: Never  . Sexual activity: Yes  Lifestyle  . Physical activity:    Days per week: Not on file    Minutes per session: Not on file  . Stress: Not on file  Relationships  . Social connections:    Talks on phone: Not on file    Gets together: Not on file    Attends religious service: Not on file    Active member of club or organization: Not on file    Attends meetings of clubs or organizations: Not on file    Relationship status: Not on file    Other Topics Concern  . Not on file  Social History Narrative  . Not on file    Family History  Problem Relation Age of Onset  . Diabetes Mother     Health Maintenance  Topic Date Due  . Samul Dada  12/09/2021  . INFLUENZA VACCINE  Completed  . HIV Screening  Completed    ----------------------------------------------------------------------------------------------------------------------------------------------------------------------------------------------------------------- Physical Exam BP 118/72   Pulse 77   Temp 98 F (36.7 C) (Oral)   Ht 6\' 1"  (1.854 m)   Wt 189 lb (85.7 kg)   SpO2 98%   BMI 24.94 kg/m   Physical Exam Constitutional:      Appearance: Normal appearance.  Eyes:     General: No scleral icterus. Skin:    General: Skin is warm and dry.     Findings: No rash.  Neurological:     General: No focal deficit present.     Mental Status: He is alert and oriented to person, place, and time.  Psychiatric:        Mood and Affect: Mood normal.        Behavior: Behavior normal.     -------------------------------------------------------------------------------------------------------------------------------------------------------------------------------------------------------------------  Assessment and Plan  Depression with anxiety -Doing well with lexapro, will continue. -F/u in 6 months.

## 2018-02-27 DIAGNOSIS — F4323 Adjustment disorder with mixed anxiety and depressed mood: Secondary | ICD-10-CM | POA: Diagnosis not present

## 2018-02-28 DIAGNOSIS — M79642 Pain in left hand: Secondary | ICD-10-CM | POA: Diagnosis not present

## 2018-02-28 DIAGNOSIS — Z8572 Personal history of non-Hodgkin lymphomas: Secondary | ICD-10-CM | POA: Diagnosis not present

## 2018-02-28 DIAGNOSIS — M79641 Pain in right hand: Secondary | ICD-10-CM | POA: Diagnosis not present

## 2018-02-28 DIAGNOSIS — F418 Other specified anxiety disorders: Secondary | ICD-10-CM | POA: Diagnosis not present

## 2018-02-28 DIAGNOSIS — M25571 Pain in right ankle and joints of right foot: Secondary | ICD-10-CM | POA: Diagnosis not present

## 2018-02-28 DIAGNOSIS — C8100 Nodular lymphocyte predominant Hodgkin lymphoma, unspecified site: Secondary | ICD-10-CM | POA: Diagnosis not present

## 2018-02-28 DIAGNOSIS — Z9484 Stem cells transplant status: Secondary | ICD-10-CM | POA: Diagnosis not present

## 2018-03-03 ENCOUNTER — Other Ambulatory Visit: Payer: Self-pay | Admitting: Family Medicine

## 2018-03-03 ENCOUNTER — Encounter: Payer: Self-pay | Admitting: Family Medicine

## 2018-03-03 DIAGNOSIS — M25572 Pain in left ankle and joints of left foot: Secondary | ICD-10-CM

## 2018-03-03 MED FILL — FLUTICASONE PROP 50 MCG SPR: 50 | 30 days supply | Qty: 16 | Fill #0

## 2018-03-03 MED FILL — LEVOCETIRIZINE 5 MG TABLET: 5 | 30 days supply | Qty: 30 | Fill #0

## 2018-03-04 ENCOUNTER — Ambulatory Visit (HOSPITAL_BASED_OUTPATIENT_CLINIC_OR_DEPARTMENT_OTHER)
Admission: RE | Admit: 2018-03-04 | Discharge: 2018-03-04 | Disposition: A | Discharge status: Home / Self Care | Payer: 59 | Source: Ambulatory Visit | Attending: Family Medicine | Admitting: Family Medicine

## 2018-03-04 DIAGNOSIS — M7989 Other specified soft tissue disorders: Secondary | ICD-10-CM | POA: Diagnosis not present

## 2018-03-04 DIAGNOSIS — M25572 Pain in left ankle and joints of left foot: Secondary | ICD-10-CM

## 2018-03-05 DIAGNOSIS — F4310 Post-traumatic stress disorder, unspecified: Secondary | ICD-10-CM | POA: Diagnosis not present

## 2018-03-05 NOTE — Progress Notes (Signed)
Xrays show some mild soft tissue swelling without signs of arthritis/degenerative changes.

## 2018-03-07 DIAGNOSIS — F4323 Adjustment disorder with mixed anxiety and depressed mood: Secondary | ICD-10-CM | POA: Diagnosis not present

## 2018-03-19 DIAGNOSIS — F4310 Post-traumatic stress disorder, unspecified: Secondary | ICD-10-CM | POA: Diagnosis not present

## 2018-03-21 DIAGNOSIS — F4323 Adjustment disorder with mixed anxiety and depressed mood: Secondary | ICD-10-CM | POA: Diagnosis not present

## 2018-04-11 DIAGNOSIS — F4323 Adjustment disorder with mixed anxiety and depressed mood: Secondary | ICD-10-CM | POA: Diagnosis not present

## 2018-04-17 DIAGNOSIS — F4323 Adjustment disorder with mixed anxiety and depressed mood: Secondary | ICD-10-CM | POA: Diagnosis not present

## 2018-04-22 DIAGNOSIS — F4310 Post-traumatic stress disorder, unspecified: Secondary | ICD-10-CM | POA: Diagnosis not present

## 2018-04-24 DIAGNOSIS — F4323 Adjustment disorder with mixed anxiety and depressed mood: Secondary | ICD-10-CM | POA: Diagnosis not present

## 2018-04-30 DIAGNOSIS — F4323 Adjustment disorder with mixed anxiety and depressed mood: Secondary | ICD-10-CM | POA: Diagnosis not present

## 2018-05-01 DIAGNOSIS — F4310 Post-traumatic stress disorder, unspecified: Secondary | ICD-10-CM | POA: Diagnosis not present

## 2018-05-15 DIAGNOSIS — F4310 Post-traumatic stress disorder, unspecified: Secondary | ICD-10-CM | POA: Diagnosis not present

## 2018-05-26 DIAGNOSIS — F4310 Post-traumatic stress disorder, unspecified: Secondary | ICD-10-CM | POA: Diagnosis not present

## 2018-06-03 DIAGNOSIS — F4310 Post-traumatic stress disorder, unspecified: Secondary | ICD-10-CM | POA: Diagnosis not present

## 2018-06-09 MED FILL — CLOTRIMAZOLE-BETAMETHASONE: 1-0.05 | 28 days supply | Qty: 15 | Fill #0

## 2018-06-09 MED FILL — ESCITALOPRAM 10 MG TABLET: 10 | 90 days supply | Qty: 90 | Fill #0

## 2018-06-09 MED FILL — FLUTICASONE PROP 50 MCG SPR: 50 | 30 days supply | Qty: 16 | Fill #0

## 2018-06-11 DIAGNOSIS — F4310 Post-traumatic stress disorder, unspecified: Secondary | ICD-10-CM | POA: Diagnosis not present

## 2018-06-11 DIAGNOSIS — F4323 Adjustment disorder with mixed anxiety and depressed mood: Secondary | ICD-10-CM | POA: Diagnosis not present

## 2018-06-12 ENCOUNTER — Encounter: Payer: Self-pay | Admitting: Family Medicine

## 2018-06-12 ENCOUNTER — Telehealth (INDEPENDENT_AMBULATORY_CARE_PROVIDER_SITE_OTHER): Payer: 59 | Admitting: Family Medicine

## 2018-06-12 DIAGNOSIS — R21 Rash and other nonspecific skin eruption: Secondary | ICD-10-CM | POA: Insufficient documentation

## 2018-06-12 NOTE — Progress Notes (Signed)
Gregory Holmes - 38 y.o. male MRN [id number]  Date of birth: 1980-09-08   This visit type was conducted due to national recommendations for restrictions regarding the COVID-19 Pandemic (e.g. social distancing).  This format is felt to be most appropriate for this patient at this time.  All issues noted in this document were discussed and addressed.  No physical exam was performed (except for noted visual exam findings with Video Visits).  I discussed the limitations of evaluation and management by telemedicine and the availability of in person appointments. The patient expressed understanding and agreed to proceed.  I connected with@ on 06/12/18 at  8:30 AM EDT by a video enabled telemedicine application and verified that I am speaking with the correct person using two identifiers.   Patient Location: Home [place] [address]   Provider location:   Home office  Chief Complaint  Patient presents with  . Rash    on ABD/Chest/Arms, Onset : 2 wks, episodic itch on hip, spreading,raise/red angry- [employer] employee exposeure , looked it up - COVID29 has SX/Sy Rash     HPI  Gregory Holmes is a 38 y.o. male who presents via audio/video conferencing for a telehealth visit today.  He has complaint of rash.  Rash started about 2 weeks ago.  Started on chest and has spread throughout trunk and upper arms.  Rash is itchy. He did complete a "teledoc" visit last week and was prescribed clotrimazole-betamethasone cream.  He tried this for two days but didn't notice much improvement so he stopped.  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  He has not had any other symptoms to suggest COVID including fever, chills, respiratory symptoms, body aches, headache, sore throat or changes to taste or smell.  He denies any changes in medications, soaps, or laundry detergents.  He had follow up with his oncologist at the first of the year as well with normal  labs.     ROS:  A comprehensive ROS was completed and negative except as noted per HPI  Past Medical History:  Diagnosis Date  . GERD (gastroesophageal reflux disease)   . Hodgkin's lymphoma ([place])   . Seasonal allergies     Past Surgical History:  Procedure Laterality Date  . LIMBAL STEM CELL TRANSPLANT      Family History  Problem Relation Age of Onset  . Diabetes Mother     Social History   Socioeconomic History  . Marital status: Married    Spouse name: Not on file  . Number of children: Not on file  . Years of education: Not on file  . Highest education level: Not on file  Occupational History  . Not on file  Social Needs  . Financial resource strain: Not on file  . Food insecurity:    Worry: Not on file    Inability: Not on file  . Transportation needs:    Medical: Not on file    Non-medical: Not on file  Tobacco Use  . Smoking status: Current Every Day Smoker    Packs/day: 1.00    Types: Cigarettes  . Smokeless tobacco: Never Used  Substance and Sexual Activity  . Alcohol use: Yes    Comment: occasional  . Drug use: Never  . Sexual activity: Yes  Lifestyle  . Physical activity:    Days per week: Not on file    Minutes per session: Not on file  . Stress: Not  on file  Relationships  . Social connections:    Talks on phone: Not on file    Gets together: Not on file    Attends religious service: Not on file    Active member of club or organization: Not on file    Attends meetings of clubs or organizations: Not on file    Relationship status: Not on file  . Intimate partner violence:    Fear of current or ex partner: Not on file    Emotionally abused: Not on file    Physically abused: Not on file    Forced sexual activity: Not on file  Other Topics Concern  . Not on file  Social History Narrative  . Not on file     Current Outpatient Medications:  .  Cholecalciferol (VITAMIN D-1000 MAX ST) 25 MCG (1000 UT) tablet, Take by mouth., Disp: ,  Rfl:  .  clotrimazole-betamethasone (LOTRISONE) cream, , Disp: , Rfl:  .  escitalopram (LEXAPRO) 10 MG tablet, Take 1 tablet (10 mg total) by mouth daily., Disp: 90 tablet, Rfl: 1 .  famotidine (PEPCID) 20 MG tablet, Take 1 tablet (20 mg total) by mouth 2 (two) times daily., Disp: 60 tablet, Rfl: 3 .  fluticasone (FLONASE) 50 MCG/ACT nasal spray, Place 2 sprays into both nostrils daily., Disp: 16 g, Rfl: 6 .  levocetirizine (XYZAL) 5 MG tablet, Take 1 tablet (5 mg total) by mouth every evening., Disp: 30 tablet, Rfl: 6  EXAM:  VITALS per patient if applicable: BP 295/62 Comment: reported from [facility]  Pulse 85   Temp 97.9 F (36.6 C) (Temporal)   Ht 6\' 1"  (1.854 m)   Wt 192 lb (87.1 kg)   BMI 25.33 kg/m   GENERAL: alert, oriented, appears well and in no acute distress  HEENT: atraumatic, conjunttiva clear, no obvious abnormalities on inspection of external nose and ears  NECK: normal movements of the head and neck  LUNGS: on inspection no signs of respiratory distress, breathing rate appears normal, no obvious gross SOB, gasping or wheezing  CV: no obvious cyanosis  SKIN:  Scattered erythematous papules along trunk and upper arms.  Slightly raised areas around R hip, almost hive like in this area.   MS: moves all visible extremities without noticeable abnormality  PSYCH/NEURO: pleasant and cooperative, no obvious depression or anxiety, speech and thought processing grossly intact  ASSESSMENT AND PLAN:  Discussed the following assessment and plan:  Rash -Unclear etiology, no other symptoms suggestive of COVID.  No new medications or foods. ?contact dermatitis.  Has history of stem cell transplant as well so graft vs host disease in differential as well but less likely given absence of other symptoms such as GI complaints.  -He will continue lotrisone for now and let me know if not improving.   -Will avoid systemic steroids for now given small potential that COVID may be  contributing.        I discussed the assessment and treatment plan with the patient. The patient was provided an opportunity to ask questions and all were answered. The patient agreed with the plan and demonstrated an understanding of the instructions.   The patient was advised to call back or seek an in-person evaluation if the symptoms worsen or if the condition fails to improve as anticipated.     Luetta Nutting, DO

## 2018-06-12 NOTE — Assessment & Plan Note (Addendum)
-  Unclear etiology, no other symptoms suggestive of COVID.  No new medications or foods. ?contact dermatitis.  Has history of stem cell transplant as well so graft vs host disease in differential as well but less likely given absence of other symptoms such as GI complaints.  -He will continue lotrisone for now and let me know if not improving.   -Will avoid systemic steroids for now given small potential that COVID may be contributing.

## 2018-06-26 DIAGNOSIS — F4323 Adjustment disorder with mixed anxiety and depressed mood: Secondary | ICD-10-CM | POA: Diagnosis not present

## 2018-06-26 DIAGNOSIS — F4310 Post-traumatic stress disorder, unspecified: Secondary | ICD-10-CM | POA: Diagnosis not present

## 2018-07-14 DIAGNOSIS — F4323 Adjustment disorder with mixed anxiety and depressed mood: Secondary | ICD-10-CM | POA: Diagnosis not present

## 2018-07-24 DIAGNOSIS — F4323 Adjustment disorder with mixed anxiety and depressed mood: Secondary | ICD-10-CM | POA: Diagnosis not present

## 2018-07-28 DIAGNOSIS — F4310 Post-traumatic stress disorder, unspecified: Secondary | ICD-10-CM | POA: Diagnosis not present

## 2018-08-07 DIAGNOSIS — F4323 Adjustment disorder with mixed anxiety and depressed mood: Secondary | ICD-10-CM | POA: Diagnosis not present

## 2018-08-19 DIAGNOSIS — F4323 Adjustment disorder with mixed anxiety and depressed mood: Secondary | ICD-10-CM | POA: Diagnosis not present

## 2018-08-20 DIAGNOSIS — F4310 Post-traumatic stress disorder, unspecified: Secondary | ICD-10-CM | POA: Diagnosis not present

## 2018-08-25 MED FILL — FLUTICASONE PROP 50 MCG SPR: 50 | 30 days supply | Qty: 16 | Fill #1

## 2018-08-26 ENCOUNTER — Ambulatory Visit: Payer: 59 | Admitting: Family Medicine

## 2018-08-27 ENCOUNTER — Ambulatory Visit: Payer: 59 | Admitting: Family Medicine

## 2018-09-04 ENCOUNTER — Telehealth (INDEPENDENT_AMBULATORY_CARE_PROVIDER_SITE_OTHER): Payer: 59 | Admitting: Family Medicine

## 2018-09-04 ENCOUNTER — Other Ambulatory Visit: Payer: Self-pay

## 2018-09-04 ENCOUNTER — Encounter: Payer: Self-pay | Admitting: Family Medicine

## 2018-09-04 DIAGNOSIS — F418 Other specified anxiety disorders: Secondary | ICD-10-CM

## 2018-09-04 DIAGNOSIS — F4323 Adjustment disorder with mixed anxiety and depressed mood: Secondary | ICD-10-CM | POA: Diagnosis not present

## 2018-09-04 MED ORDER — ESCITALOPRAM OXALATE 20 MG PO TABS: 20.0000 mg | ORAL_TABLET | Freq: Every day | ORAL | 1 refills | Status: DC

## 2018-09-04 MED FILL — ESCITALOPRAM 20 MG TABLET: 20 | 90 days supply | Qty: 90 | Fill #0

## 2018-09-04 NOTE — Assessment & Plan Note (Addendum)
-  Worsened anxiety, will increase lexapro to 20mg . Continue meditation/prayer, relaxation techniques to help with anxiety.  -Follow up in 8 weeks.

## 2018-09-04 NOTE — Progress Notes (Signed)
Gregory Holmes - 38 y.o. male MRN [id number]  Date of birth: Mar 22, 1980   This visit type was conducted due to national recommendations for restrictions regarding the COVID-19 Pandemic (e.g. social distancing).  This format is felt to be most appropriate for this patient at this time.  All issues noted in this document were discussed and addressed.  No physical exam was performed (except for noted visual exam findings with Video Visits).  I discussed the limitations of evaluation and management by telemedicine and the availability of in person appointments. The patient expressed understanding and agreed to proceed.  I connected with@ on 09/04/18 at  9:00 AM EDT by a video enabled telemedicine application and verified that I am speaking with the correct person using two identifiers.   Patient Location: Home  Gregory Holmes [id number]   Provider location:   Home office  Chief Complaint  Patient presents with  . Follow-up    follow up on anxiety--feel like anxiety is increasing--lexapro is helping but not as much any more.     HPI  Gregory Holmes is a 38 y.o. male who presents via audio/video conferencing for a telehealth visit today.  He is following up today for anxiety.  He reports that he feels that anxiety seems to worsening some.  In addition to his lexapro he has tried prayer, meditation and mindfulness to help with anxiety.  He would like to try increasing medication.   Depression screen [facility] 2/9 09/04/2018 12/30/2017 07/31/2017  Decreased Interest 2 2 0  Down, Depressed, Hopeless 2 3 0  PHQ - 2 Score 4 5 0  Altered sleeping 2 1 -  Tired, decreased energy 3 3 -  Change in appetite 2 2 -  Feeling bad or failure about yourself  2 3 -  Trouble concentrating 0 2 -  Moving slowly or fidgety/restless 0 2 -  Suicidal thoughts 0 0 -  PHQ-9 Score 13 18 -   GAD 7 : Generalized Anxiety Score 09/04/2018 12/30/2017  Nervous, Anxious, on Edge 2 3  Control/stop worrying 2 2  Worry  too much - different things 2 2  Trouble relaxing 1 2  Restless 0 2  Easily annoyed or irritable 2 2  Afraid - awful might happen 2 2  Total GAD 7 Score 11 15       ROS:  A comprehensive ROS was completed and negative except as noted per HPI  Past Medical History:  Diagnosis Date  . GERD (gastroesophageal reflux disease)   . Hodgkin's lymphoma ([place])   . Seasonal allergies     Past Surgical History:  Procedure Laterality Date  . LIMBAL STEM CELL TRANSPLANT      Family History  Problem Relation Age of Onset  . Diabetes Mother     Social History   Socioeconomic History  . Marital status: Married    Spouse name: Not on file  . Number of children: Not on file  . Years of education: Not on file  . Highest education level: Not on file  Occupational History  . Not on file  Social Needs  . Financial resource strain: Not on file  . Food insecurity    Worry: Not on file    Inability: Not on file  . Transportation needs    Medical: Not on file    Non-medical: Not on file  Tobacco Use  . Smoking status: Current Every Day Smoker    Packs/day: 1.00    Types: Cigarettes  .  Smokeless tobacco: Never Used  Substance and Sexual Activity  . Alcohol use: Yes    Comment: occasional  . Drug use: Never  . Sexual activity: Yes  Lifestyle  . Physical activity    Days per week: Not on file    Minutes per session: Not on file  . Stress: Not on file  Relationships  . Social Herbalist on phone: Not on file    Gets together: Not on file    Attends religious service: Not on file    Active member of club or organization: Not on file    Attends meetings of clubs or organizations: Not on file    Relationship status: Not on file  . Intimate partner violence    Fear of current or ex partner: Not on file    Emotionally abused: Not on file    Physically abused: Not on file    Forced sexual activity: Not on file  Other Topics Concern  . Not on file  Social History  Narrative  . Not on file     Current Outpatient Medications:  .  Cholecalciferol (VITAMIN D-1000 MAX ST) 25 MCG (1000 UT) tablet, Take by mouth., Disp: , Rfl:  .  escitalopram (LEXAPRO) 20 MG tablet, Take 1 tablet (20 mg total) by mouth daily., Disp: 90 tablet, Rfl: 1 .  famotidine (PEPCID) 20 MG tablet, Take 1 tablet (20 mg total) by mouth 2 (two) times daily., Disp: 60 tablet, Rfl: 3 .  fluticasone (FLONASE) 50 MCG/ACT nasal spray, Place 2 sprays into both nostrils daily., Disp: 16 g, Rfl: 6 .  clotrimazole-betamethasone (LOTRISONE) cream, , Disp: , Rfl:  .  levocetirizine (XYZAL) 5 MG tablet, Take 1 tablet (5 mg total) by mouth every evening. (Patient not taking: Reported on 09/04/2018), Disp: 30 tablet, Rfl: 6  EXAM:  VITALS per patient if applicable: Pulse 84 Comment: pt report  Ht 6\' 1"  (1.854 m)   Wt 198 lb (89.8 kg) Comment: pt reprt  BMI 26.12 kg/m   GENERAL: alert, oriented, appears well and in no acute distress  HEENT: atraumatic, conjunttiva clear, no obvious abnormalities on inspection of external nose and ears  NECK: normal movements of the head and neck  LUNGS: on inspection no signs of respiratory distress, breathing rate appears normal, no obvious gross SOB, gasping or wheezing  CV: no obvious cyanosis  MS: moves all visible extremities without noticeable abnormality  PSYCH/NEURO: pleasant and cooperative, no obvious depression or anxiety, speech and thought processing grossly intact  ASSESSMENT AND PLAN:  Discussed the following assessment and plan:  Depression with anxiety -Worsened anxiety, will increase lexapro to 20mg . Continue meditation/prayer, relaxation techniques to help with anxiety.  -Follow up in 8 weeks.        I discussed the assessment and treatment plan with the patient. The patient was provided an opportunity to ask questions and all were answered. The patient agreed with the plan and demonstrated an understanding of the instructions.    The patient was advised to call back or seek an in-person evaluation if the symptoms worsen or if the condition fails to improve as anticipated.    Gregory Nutting, DO

## 2018-09-08 DIAGNOSIS — F4323 Adjustment disorder with mixed anxiety and depressed mood: Secondary | ICD-10-CM | POA: Diagnosis not present

## 2018-09-09 DIAGNOSIS — F4310 Post-traumatic stress disorder, unspecified: Secondary | ICD-10-CM | POA: Diagnosis not present

## 2018-09-24 DIAGNOSIS — F4323 Adjustment disorder with mixed anxiety and depressed mood: Secondary | ICD-10-CM | POA: Diagnosis not present

## 2018-09-25 DIAGNOSIS — F4310 Post-traumatic stress disorder, unspecified: Secondary | ICD-10-CM | POA: Diagnosis not present

## 2018-10-07 DIAGNOSIS — F4323 Adjustment disorder with mixed anxiety and depressed mood: Secondary | ICD-10-CM | POA: Diagnosis not present

## 2018-10-07 DIAGNOSIS — F4310 Post-traumatic stress disorder, unspecified: Secondary | ICD-10-CM | POA: Diagnosis not present

## 2018-10-20 DIAGNOSIS — F4323 Adjustment disorder with mixed anxiety and depressed mood: Secondary | ICD-10-CM | POA: Diagnosis not present

## 2018-10-21 DIAGNOSIS — F4310 Post-traumatic stress disorder, unspecified: Secondary | ICD-10-CM | POA: Diagnosis not present

## 2018-10-30 ENCOUNTER — Telehealth (INDEPENDENT_AMBULATORY_CARE_PROVIDER_SITE_OTHER): Payer: 59 | Admitting: Family Medicine

## 2018-10-30 ENCOUNTER — Encounter: Payer: Self-pay | Admitting: Family Medicine

## 2018-10-30 ENCOUNTER — Other Ambulatory Visit: Payer: Self-pay

## 2018-10-30 DIAGNOSIS — F4310 Post-traumatic stress disorder, unspecified: Secondary | ICD-10-CM | POA: Diagnosis not present

## 2018-10-30 DIAGNOSIS — J069 Acute upper respiratory infection, unspecified: Secondary | ICD-10-CM | POA: Diagnosis not present

## 2018-10-30 DIAGNOSIS — F418 Other specified anxiety disorders: Secondary | ICD-10-CM

## 2018-10-30 NOTE — Progress Notes (Signed)
Gregory Holmes - 38 y.o. male MRN [id number]  Date of birth: October 10, 1980   This visit type was conducted due to national recommendations for restrictions regarding the COVID-19 Pandemic (e.g. social distancing).  This format is felt to be most appropriate for this patient at this time.  All issues noted in this document were discussed and addressed.  No physical exam was performed (except for noted visual exam findings with Video Visits).  I discussed the limitations of evaluation and management by telemedicine and the availability of in person appointments. The patient expressed understanding and agreed to proceed.  I connected with@ on 10/30/18 at  9:00 AM EDT by a video enabled telemedicine application and verified that I am speaking with the correct person using two identifiers.   Patient Location: Home [place] [address]   Provider location:   Home office  Chief Complaint  Patient presents with  . Follow-up    follow up on anxiety--doing good with increase dosage  . Cough    pt is c/o of coughing,runny nose,sore throat and ears pain/going on 3 days/took nyquil/ FYI--waiting for COVID test result.     HPI  Gregory Holmes is a 38 y.o. male who presents via audio/video conferencing for a telehealth visit today.  He is following up today for depression and anxiety.  He was having increased symptoms at previous appt and lexapro was increased to 20mg .  He reports increase of medication is really helpful.  He is tolerating well but he has had some dry mouth with medication.   He also reports some congestion with cough, sore throat and ear pressure/pain bilaterally.  This started about 3-4 days ago.  He has been tested for COVID and is awaiting results.  He denies fever, chills, sinus pain, headache, nausea, vomiting, diarrhea.  He is using flonase and wife brought home a rapid strep test that was negative.   ROS:  A comprehensive ROS was completed and negative except as  noted per HPI    ROS:  A comprehensive ROS was completed and negative except as noted per HPI  Past Medical History:  Diagnosis Date  . GERD (gastroesophageal reflux disease)   . Hodgkin's lymphoma ([place])   . Seasonal allergies     Past Surgical History:  Procedure Laterality Date  . LIMBAL STEM CELL TRANSPLANT      Family History  Problem Relation Age of Onset  . Diabetes Mother     Social History   Socioeconomic History  . Marital status: Married    Spouse name: Not on file  . Number of children: Not on file  . Years of education: Not on file  . Highest education level: Not on file  Occupational History  . Not on file  Social Needs  . Financial resource strain: Not on file  . Food insecurity    Worry: Not on file    Inability: Not on file  . Transportation needs    Medical: Not on file    Non-medical: Not on file  Tobacco Use  . Smoking status: Current Every Day Smoker    Packs/day: 1.00    Types: Cigarettes  . Smokeless tobacco: Never Used  Substance and Sexual Activity  . Alcohol use: Yes    Comment: occasional  . Drug use: Never  . Sexual activity: Yes  Lifestyle  . Physical activity    Days per week: Not on file    Minutes per session: Not on file  .  Stress: Not on file  Relationships  . Social Herbalist on phone: Not on file    Gets together: Not on file    Attends religious service: Not on file    Active member of club or organization: Not on file    Attends meetings of clubs or organizations: Not on file    Relationship status: Not on file  . Intimate partner violence    Fear of current or ex partner: Not on file    Emotionally abused: Not on file    Physically abused: Not on file    Forced sexual activity: Not on file  Other Topics Concern  . Not on file  Social History Narrative  . Not on file     Current Outpatient Medications:  .  Cholecalciferol (VITAMIN D-1000 MAX ST) 25 MCG (1000 UT) tablet, Take by mouth., Disp: ,  Rfl:  .  escitalopram (LEXAPRO) 20 MG tablet, Take 1 tablet (20 mg total) by mouth daily., Disp: 90 tablet, Rfl: 1 .  famotidine (PEPCID) 20 MG tablet, Take 1 tablet (20 mg total) by mouth 2 (two) times daily., Disp: 60 tablet, Rfl: 3 .  fluticasone (FLONASE) 50 MCG/ACT nasal spray, Place 2 sprays into both nostrils daily., Disp: 16 g, Rfl: 6 .  clotrimazole-betamethasone (LOTRISONE) cream, , Disp: , Rfl:  .  levocetirizine (XYZAL) 5 MG tablet, Take 1 tablet (5 mg total) by mouth every evening. (Patient not taking: Reported on 09/04/2018), Disp: 30 tablet, Rfl: 6  EXAM:  VITALS per patient if applicable: Pulse 91 Comment: pt reprt  Temp 97.9 F (36.6 C) (Oral) Comment: pt report  Ht 6\' 1"  (1.854 m)   BMI 26.12 kg/m   GENERAL: alert, oriented, appears well and in no acute distress  HEENT: atraumatic, conjunttiva clear, no obvious abnormalities on inspection of external nose and ears  NECK: normal movements of the head and neck  LUNGS: on inspection no signs of respiratory distress, breathing rate appears normal, no obvious gross SOB, gasping or wheezing  CV: no obvious cyanosis  MS: moves all visible extremities without noticeable abnormality  PSYCH/NEURO: pleasant and cooperative, no obvious depression or anxiety, speech and thought processing grossly intact  ASSESSMENT AND PLAN:  Discussed the following assessment and plan:  Depression with anxiety -Improved with increase in lexapro, continue at current dose.  -He will continue counseling as well.  -Follow up in 3 months.   Viral URI -Awaiting COVID-19 testing results -Continue supportive care.  -Discussed that if COVID testing is negative and continues to have symptoms we can evaluate him in office.        I discussed the assessment and treatment plan with the patient. The patient was provided an opportunity to ask questions and all were answered. The patient agreed with the plan and demonstrated an understanding of  the instructions.   The patient was advised to call back or seek an in-person evaluation if the symptoms worsen or if the condition fails to improve as anticipated.    Luetta Nutting, DO

## 2018-10-30 NOTE — Assessment & Plan Note (Signed)
-  Improved with increase in lexapro, continue at current dose.  -He will continue counseling as well.  -Follow up in 3 months.

## 2018-10-30 NOTE — Assessment & Plan Note (Signed)
-  Awaiting COVID-19 testing results -Continue supportive care.  -Discussed that if COVID testing is negative and continues to have symptoms we can evaluate him in office.

## 2018-10-31 ENCOUNTER — Other Ambulatory Visit: Payer: Self-pay | Admitting: Family Medicine

## 2018-10-31 MED ORDER — AMOXICILLIN-POT CLAVULANATE 875-125 MG PO TABS: 1.0000 | ORAL_TABLET | Freq: Two times a day (BID) | ORAL | 0 refills | Status: DC

## 2018-10-31 MED FILL — AMOX-CLAV 875-125 MG TABLET: 875-125 | 10 days supply | Qty: 20 | Fill #0

## 2018-10-31 NOTE — Progress Notes (Signed)
augmentin

## 2018-11-05 DIAGNOSIS — F4323 Adjustment disorder with mixed anxiety and depressed mood: Secondary | ICD-10-CM | POA: Diagnosis not present

## 2018-11-18 DIAGNOSIS — F4323 Adjustment disorder with mixed anxiety and depressed mood: Secondary | ICD-10-CM | POA: Diagnosis not present

## 2018-11-25 ENCOUNTER — Telehealth: Payer: Self-pay

## 2018-11-25 NOTE — Telephone Encounter (Signed)
Questions for Screening COVID-19  Symptom onset: N/a  Travel or Contacts: No  During this illness, did/does the patient experience any of the following symptoms? Fever >100.4F []  Yes [x]  No []  Unknown Subjective fever (felt feverish) []  Yes [x]  No []  Unknown Chills []  Yes [x]  No []  Unknown Muscle aches (myalgia) []  Yes [x]  No []  Unknown Runny nose (rhinorrhea) []  Yes [x]  No []  Unknown Sore throat []  Yes [x]  No []  Unknown Cough (new onset or worsening of chronic cough) []  Yes [x]  No []  Unknown Shortness of breath (dyspnea) []  Yes [x]  No []  Unknown Nausea or vomiting []  Yes [x]  No []  Unknown Headache []  Yes [x]  No []  Unknown Abdominal pain  []  Yes [x]  No []  Unknown Diarrhea (?3 loose/looser than normal stools/24hr period) []  Yes [x]  No []  Unknown  

## 2018-11-26 ENCOUNTER — Ambulatory Visit (INDEPENDENT_AMBULATORY_CARE_PROVIDER_SITE_OTHER): Payer: 59 | Admitting: Family Medicine

## 2018-11-26 ENCOUNTER — Encounter: Payer: Self-pay | Admitting: Family Medicine

## 2018-11-26 VITALS — BP 112/70 | HR 73 | Temp 98.3°F | Ht 73.0 in | Wt 206.2 lb

## 2018-11-26 DIAGNOSIS — F17210 Nicotine dependence, cigarettes, uncomplicated: Secondary | ICD-10-CM | POA: Diagnosis not present

## 2018-11-26 DIAGNOSIS — H6981 Other specified disorders of Eustachian tube, right ear: Secondary | ICD-10-CM | POA: Diagnosis not present

## 2018-11-26 DIAGNOSIS — Z13220 Encounter for screening for lipoid disorders: Secondary | ICD-10-CM

## 2018-11-26 DIAGNOSIS — Z0000 Encounter for general adult medical examination without abnormal findings: Secondary | ICD-10-CM

## 2018-11-26 DIAGNOSIS — H6991 Unspecified Eustachian tube disorder, right ear: Secondary | ICD-10-CM

## 2018-11-26 LAB — COMPREHENSIVE METABOLIC PANEL
ALT: 30 U/L (ref 0–53)
AST: 19 U/L (ref 0–37)
Albumin: 4.4 g/dL (ref 3.5–5.2)
Alkaline Phosphatase: 46 U/L (ref 39–117)
BUN: 15 mg/dL (ref 6–23)
CO2: 29 mEq/L (ref 19–32)
Calcium: 10.2 mg/dL (ref 8.4–10.5)
Chloride: 105 mEq/L (ref 96–112)
Creatinine, Ser: 1.05 mg/dL (ref 0.40–1.50)
GFR: 78.17 mL/min (ref >60.00)
Glucose, Bld: 95 mg/dL (ref 70–99)
Potassium: 4.1 mEq/L (ref 3.5–5.1)
Sodium: 138 mEq/L (ref 135–145)
Total Bilirubin: 0.4 mg/dL (ref 0.2–1.2)
Total Protein: 6.1 g/dL (ref 6.0–8.3)

## 2018-11-26 LAB — LIPID PANEL
Cholesterol: 224 mg/dL — ABNORMAL HIGH (ref 0–200)
HDL: 35.4 mg/dL — ABNORMAL LOW (ref >39.00)
LDL Cholesterol: 157 mg/dL — ABNORMAL HIGH (ref 0–99)
NonHDL: 188.44
Total CHOL/HDL Ratio: 6
Triglycerides: 156 mg/dL — ABNORMAL HIGH (ref 0.0–149.0)
VLDL: 31.2 mg/dL (ref 0.0–40.0)

## 2018-11-26 LAB — CBC WITH DIFFERENTIAL/PLATELET
Basophils Absolute: 0.1 10*3/uL (ref 0.0–0.1)
Basophils Relative: 1.2 % (ref 0.0–3.0)
Eosinophils Absolute: 0.2 10*3/uL (ref 0.0–0.7)
Eosinophils Relative: 3 % (ref 0.0–5.0)
HCT: 39.4 % (ref 39.0–52.0)
Hemoglobin: 13.4 g/dL (ref 13.0–17.0)
Lymphocytes Relative: 20.9 % (ref 12.0–46.0)
Lymphs Abs: 1.2 10*3/uL (ref 0.7–4.0)
MCHC: 33.9 g/dL (ref 30.0–36.0)
MCV: 91 fl (ref 78.0–100.0)
Monocytes Absolute: 0.6 10*3/uL (ref 0.1–1.0)
Monocytes Relative: 10.7 % (ref 3.0–12.0)
Neutro Abs: 3.6 10*3/uL (ref 1.4–7.7)
Neutrophils Relative %: 64.2 % (ref 43.0–77.0)
Platelets: 231 10*3/uL (ref 150.0–400.0)
RBC: 4.33 Mil/uL (ref 4.22–5.81)
RDW: 13.1 % (ref 11.5–15.5)
WBC: 5.6 10*3/uL (ref 4.0–10.5)

## 2018-11-26 MED ORDER — CHANTIX STARTING MONTH PAK 0.5 MG X 11 & 1 MG X 42 PO TABS: ORAL_TABLET | ORAL | 0 refills | Status: DC

## 2018-11-26 MED FILL — CHANTIX STARTING MONTH BOX: 0.5 MG X 11 | 28 days supply | Qty: 53 | Fill #0

## 2018-11-26 NOTE — Assessment & Plan Note (Signed)
-  We discussed smoking cessation and will provide trial of chantix.  Recommend setting quit date for shortly after starting medication.

## 2018-11-26 NOTE — Assessment & Plan Note (Signed)
-  continue flonase.  He may add short term sudafed as well if needed.

## 2018-11-26 NOTE — Patient Instructions (Signed)
Preventive Care 40-40 Years Old, Male Preventive care refers to lifestyle choices and visits with your health care provider that can promote health and wellness. This includes:  A yearly physical exam. This is also called an annual well check.  Regular dental and eye exams.  Immunizations.  Screening for certain conditions.  Healthy lifestyle choices, such as eating a healthy diet, getting regular exercise, not using drugs or products that contain nicotine and tobacco, and limiting alcohol use. What can I expect for my preventive care visit? Physical exam Your health care provider will check:  Height and weight. These may be used to calculate body mass index (BMI), which is a measurement that tells if you are at a healthy weight.  Heart rate and blood pressure.  Your skin for abnormal spots. Counseling Your health care provider may ask you questions about:  Alcohol, tobacco, and drug use.  Emotional well-being.  Home and relationship well-being.  Sexual activity.  Eating habits.  Work and work environment. What immunizations do I need?  Influenza (flu) vaccine  This is recommended every year. Tetanus, diphtheria, and pertussis (Tdap) vaccine  You may need a Td booster every 10 years. Varicella (chickenpox) vaccine  You may need this vaccine if you have not already been vaccinated. Zoster (shingles) vaccine  You may need this after age 60. Measles, mumps, and rubella (MMR) vaccine  You may need at least one dose of MMR if you were born in 1957 or later. You may also need a second dose. Pneumococcal conjugate (PCV13) vaccine  You may need this if you have certain conditions and were not previously vaccinated. Pneumococcal polysaccharide (PPSV23) vaccine  You may need one or two doses if you smoke cigarettes or if you have certain conditions. Meningococcal conjugate (MenACWY) vaccine  You may need this if you have certain conditions. Hepatitis A vaccine   You may need this if you have certain conditions or if you travel or work in places where you may be exposed to hepatitis A. Hepatitis B vaccine  You may need this if you have certain conditions or if you travel or work in places where you may be exposed to hepatitis B. Haemophilus influenzae type b (Hib) vaccine  You may need this if you have certain risk factors. Human papillomavirus (HPV) vaccine  If recommended by your health care provider, you may need three doses over 6 months. You may receive vaccines as individual doses or as more than one vaccine together in one shot (combination vaccines). Talk with your health care provider about the risks and benefits of combination vaccines. What tests do I need? Blood tests  Lipid and cholesterol levels. These may be checked every 5 years, or more frequently if you are over 50 years old.  Hepatitis C test.  Hepatitis B test. Screening  Lung cancer screening. You may have this screening every year starting at age 55 if you have a 30-pack-year history of smoking and currently smoke or have quit within the past 15 years.  Prostate cancer screening. Recommendations will vary depending on your family history and other risks.  Colorectal cancer screening. All adults should have this screening starting at age 50 and continuing until age 75. Your health care provider may recommend screening at age 45 if you are at increased risk. You will have tests every 1-10 years, depending on your results and the type of screening test.  Diabetes screening. This is done by checking your blood sugar (glucose) after you have not eaten   for a while (fasting). You may have this done every 1-3 years.  Sexually transmitted disease (STD) testing. Follow these instructions at home: Eating and drinking  Eat a diet that includes fresh fruits and vegetables, whole grains, lean protein, and low-fat dairy products.  Take vitamin and mineral supplements as recommended  by your health care provider.  Do not drink alcohol if your health care provider tells you not to drink.  If you drink alcohol: ? Limit how much you have to 0-2 drinks a day. ? Be aware of how much alcohol is in your drink. In the [place] one drink equals one 12 oz bottle of beer (355 mL), one 5 oz glass of wine (148 mL), or one 1 oz glass of hard liquor (44 mL). Lifestyle  Take daily care of your teeth and gums.  Stay active. Exercise for at least 30 minutes on 5 or more days each week.  Do not use any products that contain nicotine or tobacco, such as cigarettes, e-cigarettes, and chewing tobacco. If you need help quitting, ask your health care provider.  If you are sexually active, practice safe sex. Use a condom or other form of protection to prevent STIs (sexually transmitted infections).  Talk with your health care provider about taking a low-dose aspirin every day starting at age 33. What's next?  Go to your health care provider once a year for a well check visit.  Ask your health care provider how often you should have your eyes and teeth checked.  Stay up to date on all vaccines. This information is not intended to replace advice given to you by your health care provider. Make sure you discuss any questions you have with your health care provider. Document Released: 02/11/2015 Document Revised: 01/09/2018 Document Reviewed: 01/09/2018 Elsevier Patient Education  2020 [employer].

## 2018-11-26 NOTE — Assessment & Plan Note (Signed)
Well adult Orders Placed This Encounter  Procedures  . Comp Met (CMET)  . CBC w/Diff  . Lipid panel  . TSH  Screening: Lipid Immunizations: UTD Anticipatory guidance/Risk factor reduction:  Discussed smoking cessation, additional recommendations per AVS

## 2018-11-26 NOTE — Progress Notes (Signed)
Gregory Holmes - 38 y.o. male MRN [id number]  Date of birth: [birth year]  Subjective Chief Complaint  Patient presents with  . Annual Exam    Physical X fasting. pt still having some ear pain.    HPI Gregory Holmes is a 38 y.o. male wit history of Hodgkin's lymphoma and anxiety here today for annual exam.  He reports that overall he is doing well.  His anxiety is well controlled with lexapro at this time.  He reports some continued mild R ear pressure.  Completed course of augmentin recently for OM and symptoms have resolved for the most part.  He does exercise occasionally and tries to follow a healthy diet.  He is a daily smoker but is interested in quitting.  Tried bupropion but did not tolerate well.  He has never tried chantix.    Review of Systems  Constitutional: Negative for chills, fever, malaise/fatigue and weight loss.  HENT: Positive for ear pain. Negative for congestion and sore throat.   Eyes: Negative for blurred vision, double vision and pain.  Respiratory: Negative for cough and shortness of breath.   Cardiovascular: Negative for chest pain and palpitations.  Gastrointestinal: Negative for abdominal pain, blood in stool, constipation, heartburn and nausea.  Genitourinary: Negative for dysuria and urgency.  Musculoskeletal: Negative for joint pain and myalgias.  Neurological: Negative for dizziness and headaches.  Endo/Heme/Allergies: Does not bruise/bleed easily.  Psychiatric/Behavioral: Negative for depression. The patient is not nervous/anxious and does not have insomnia.      Past Medical History:  Diagnosis Date  . GERD (gastroesophageal reflux disease)   . Hodgkin's lymphoma ([place])   . Seasonal allergies     Past Surgical History:  Procedure Laterality Date  . LIMBAL STEM CELL TRANSPLANT      Social History   Socioeconomic History  . Marital status: Married    Spouse name: Not on file  . Number of children: Not on file  . Years of education: Not on  file  . Highest education level: Not on file  Occupational History  . Not on file  Social Needs  . Financial resource strain: Not on file  . Food insecurity    Worry: Not on file    Inability: Not on file  . Transportation needs    Medical: Not on file    Non-medical: Not on file  Tobacco Use  . Smoking status: Current Every Day Smoker    Packs/day: 1.00    Types: Cigarettes  . Smokeless tobacco: Never Used  Substance and Sexual Activity  . Alcohol use: Yes    Comment: occasional  . Drug use: Never  . Sexual activity: Yes  Lifestyle  . Physical activity    Days per week: Not on file    Minutes per session: Not on file  . Stress: Not on file  Relationships  . Social Herbalist on phone: Not on file    Gets together: Not on file    Attends religious service: Not on file    Active member of club or organization: Not on file    Attends meetings of clubs or organizations: Not on file    Relationship status: Not on file  Other Topics Concern  . Not on file  Social History Narrative  . Not on file    Family History  Problem Relation Age of Onset  . Diabetes Mother     Health Maintenance  Topic Date Due  . INFLUENZA VACCINE  08/30/2018  . TETANUS/TDAP  12/09/2021  . HIV Screening  Completed    ----------------------------------------------------------------------------------------------------------------------------------------------------------------------------------------------------------------- Physical Exam BP 112/70   Pulse 73   Temp 98.3 F (36.8 C) (Tympanic)   Ht _0  (1.854 m)   Wt 206 lb 3.2 oz (93.5 kg)   SpO2 97%   BMI 27.20 kg/m   Physical Exam Constitutional:      General: He is not in acute distress. HENT:     Head: Normocephalic and atraumatic.     Right Ear: External ear normal.     Left Ear: Tympanic membrane and external ear normal.     Ears:     Comments: Serous effusion     Mouth/Throat:     Mouth: Mucous membranes  are moist.  Eyes:     General: No scleral icterus. Neck:     Musculoskeletal: Normal range of motion.     Thyroid: No thyromegaly.  Cardiovascular:     Rate and Rhythm: Normal rate and regular rhythm.     Heart sounds: Normal heart sounds.  Pulmonary:     Effort: Pulmonary effort is normal.     Breath sounds: Normal breath sounds.  Abdominal:     General: Bowel sounds are normal. There is no distension.     Palpations: Abdomen is soft.     Tenderness: There is no abdominal tenderness. There is no guarding.  Lymphadenopathy:     Cervical: No cervical adenopathy.  Skin:    General: Skin is warm and dry.     Findings: No rash.  Neurological:     Mental Status: He is alert and oriented to person, place, and time.     Cranial Nerves: No cranial nerve deficit.     Motor: No abnormal muscle tone.  Psychiatric:        Mood and Affect: Mood normal.        Behavior: Behavior normal.     ------------------------------------------------------------------------------------------------------------------------------------------------------------------------------------------------------------------- Assessment and Plan  Nicotine dependence, cigarettes, uncomplicated -We discussed smoking cessation and will provide trial of chantix.  Recommend setting quit date for shortly after starting medication.   Eustachian tube dysfunction, right -continue flonase.  He may add short term sudafed as well if needed.   Well adult exam Well adult Orders Placed This Encounter  Procedures  . Comp Met (CMET)  . CBC w/Diff  . Lipid panel  . TSH  Screening: Lipid Immunizations: UTD Anticipatory guidance/Risk factor reduction:  Discussed smoking cessation, additional recommendations per AVS

## 2018-11-27 LAB — TSH: TSH: 2.1 u[IU]/mL (ref 0.35–4.50)

## 2018-12-03 DIAGNOSIS — F4323 Adjustment disorder with mixed anxiety and depressed mood: Secondary | ICD-10-CM | POA: Diagnosis not present

## 2018-12-11 DIAGNOSIS — F4310 Post-traumatic stress disorder, unspecified: Secondary | ICD-10-CM | POA: Diagnosis not present

## 2018-12-17 DIAGNOSIS — F4323 Adjustment disorder with mixed anxiety and depressed mood: Secondary | ICD-10-CM | POA: Diagnosis not present

## 2018-12-24 MED FILL — ESCITALOPRAM 20 MG TABLET: 20 | 90 days supply | Qty: 90 | Fill #1

## 2018-12-29 DIAGNOSIS — F4310 Post-traumatic stress disorder, unspecified: Secondary | ICD-10-CM | POA: Diagnosis not present

## 2018-12-30 DIAGNOSIS — F4323 Adjustment disorder with mixed anxiety and depressed mood: Secondary | ICD-10-CM | POA: Diagnosis not present

## 2019-01-13 DIAGNOSIS — F4310 Post-traumatic stress disorder, unspecified: Secondary | ICD-10-CM | POA: Diagnosis not present

## 2019-01-14 DIAGNOSIS — F4323 Adjustment disorder with mixed anxiety and depressed mood: Secondary | ICD-10-CM | POA: Diagnosis not present

## 2019-09-17 DIAGNOSIS — F419 Anxiety disorder, unspecified: Secondary | ICD-10-CM | POA: Diagnosis not present

## 2019-10-08 DIAGNOSIS — F419 Anxiety disorder, unspecified: Secondary | ICD-10-CM | POA: Diagnosis not present

## 2019-10-09 DIAGNOSIS — Z0000 Encounter for general adult medical examination without abnormal findings: Secondary | ICD-10-CM | POA: Diagnosis not present

## 2019-10-28 ENCOUNTER — Other Ambulatory Visit (HOSPITAL_COMMUNITY): Payer: Self-pay | Admitting: Family Medicine

## 2019-10-28 DIAGNOSIS — Z720 Tobacco use: Secondary | ICD-10-CM | POA: Diagnosis not present

## 2019-10-28 DIAGNOSIS — E7801 Familial hypercholesterolemia: Secondary | ICD-10-CM | POA: Diagnosis not present

## 2019-10-28 DIAGNOSIS — R6882 Decreased libido: Secondary | ICD-10-CM | POA: Diagnosis not present

## 2019-10-29 MED FILL — buPROPion HCL ER (XL) 150 M: 150 | 30 days supply | Qty: 30 | Fill #0

## 2019-10-30 DIAGNOSIS — R6882 Decreased libido: Secondary | ICD-10-CM | POA: Diagnosis not present

## 2019-12-11 MED FILL — buPROPion HCL ER (XL) 150 M: 150 | 30 days supply | Qty: 30 | Fill #0

## 2019-12-21 ENCOUNTER — Ambulatory Visit (INDEPENDENT_AMBULATORY_CARE_PROVIDER_SITE_OTHER): Payer: 59 | Admitting: Clinical

## 2019-12-21 ENCOUNTER — Other Ambulatory Visit: Payer: Self-pay

## 2019-12-21 DIAGNOSIS — Z566 Other physical and mental strain related to work: Secondary | ICD-10-CM | POA: Diagnosis not present

## 2019-12-21 DIAGNOSIS — Z630 Problems in relationship with spouse or partner: Secondary | ICD-10-CM | POA: Diagnosis not present

## 2019-12-21 DIAGNOSIS — F4323 Adjustment disorder with mixed anxiety and depressed mood: Secondary | ICD-10-CM | POA: Diagnosis not present
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
## 2020-01-05 ENCOUNTER — Other Ambulatory Visit: Payer: Self-pay

## 2020-01-05 ENCOUNTER — Ambulatory Visit (HOSPITAL_COMMUNITY): Payer: 59 | Admitting: Clinical

## 2020-01-11 MED FILL — buPROPion HCL ER (XL) 150 M: 150 | 30 days supply | Qty: 30 | Fill #0

## 2020-01-20 ENCOUNTER — Other Ambulatory Visit: Payer: Self-pay

## 2020-01-20 ENCOUNTER — Ambulatory Visit (INDEPENDENT_AMBULATORY_CARE_PROVIDER_SITE_OTHER): Payer: 59 | Admitting: Clinical

## 2020-01-20 DIAGNOSIS — Z566 Other physical and mental strain related to work: Secondary | ICD-10-CM | POA: Diagnosis not present

## 2020-01-20 DIAGNOSIS — F4323 Adjustment disorder with mixed anxiety and depressed mood: Secondary | ICD-10-CM | POA: Diagnosis not present

## 2020-01-20 NOTE — Progress Notes (Signed)
° °  THERAPIST PROGRESS NOTE  Session Time: 10am  Participation Level: Active  Behavioral Response: Casual and NeatAlertEuthymic  Type of Therapy: Individual Therapy  Treatment Goals addressed: Coping anxiety  Interventions: CBT and Other: psychoeducation  Location-Provider and patient [facility] [place]  Summary: MICHAL CALLICOTT is a 39 y.o. male who presents in a euthymic manner. Pt participated in completion of treatment plan. Pt reports things are going well at home, no challenges reported at this time. Pt discussed work related stressors and identified coping methods he uses to manage stress such as taking time out, deep breathing and going on a vacation. Pt acknowledges that he needs to be more consistent in his efforts to practice daily self care. [place], completed on 01/20/20 Meet with clinician routinely for therapy sessions to assess progress towards personal goals and receive assistance/feedback as needed to address barriers to success. Reduce overall frequency, intensity and duration of anxiety so that daily functioning is not impaired. Implement coping skills that result in a reduction of anxiety, stress and worry and improves daily functioning.   Suicidal/Homicidal: Pt denies plan or intent to harm himself or others. Pt encouraged to call 911 or go to closest emergency department in the event of an emergency.  Therapist Response: CSW assessed for changes in daily functioning, mood and behavior. CSW probed for feedback on pt self care routine. Assisted pt in identifying triggers at work and ways to implement stress management techniques. Discussed with pt importance of self care and provided him with information on self care tips. Assigned homework, complete mental health maintenance plan, review during next session.  Plan: Return again in 2 weeks.  Diagnosis: Axis I: adjustment disorder with mixed anxiety and    depressed mood    Work stress    Axis  II: No diagnosis    Yvette Rack, LCSW 01/20/2020

## 2020-02-08 MED FILL — buPROPion HCL ER (XL) 150 M: 150 | 30 days supply | Qty: 30 | Fill #0

## 2020-02-11 ENCOUNTER — Other Ambulatory Visit (HOSPITAL_COMMUNITY): Payer: Self-pay | Admitting: Family Medicine

## 2020-02-11 ENCOUNTER — Ambulatory Visit (INDEPENDENT_AMBULATORY_CARE_PROVIDER_SITE_OTHER): Payer: 59 | Admitting: Clinical

## 2020-02-11 ENCOUNTER — Other Ambulatory Visit: Payer: Self-pay

## 2020-02-11 DIAGNOSIS — F4323 Adjustment disorder with mixed anxiety and depressed mood: Secondary | ICD-10-CM

## 2020-02-11 DIAGNOSIS — Z566 Other physical and mental strain related to work: Secondary | ICD-10-CM | POA: Diagnosis not present

## 2020-02-11 DIAGNOSIS — B353 Tinea pedis: Secondary | ICD-10-CM | POA: Diagnosis not present

## 2020-02-11 MED FILL — FLUCONAZOLE 200 MG TABLET: 200 | 28 days supply | Qty: 4 | Fill #0

## 2020-02-11 NOTE — Progress Notes (Signed)
   THERAPIST PROGRESS NOTE  Session Time: 10am  Participation Level: Active  Behavioral Response: Casual and NeatAlertEuthymic  Type of Therapy: Individual Therapy  Treatment Goals addressed: Anxiety and Coping  Interventions: CBT  Location-Provider and patient [facility] [place]  Summary: Gregory Holmes is a 40 y.o. male who presents in a euthymic mood. Pt rates his mood as 7/10, 10=good. Pt does not report any negative mood or behavior changes. Pt says he walks 2 miles on his days off and states having a routine/schedule assist him in managing his anxiety. Pt identified criticism, disagreements at home and being overly busy at work as his main triggers. Nevertheless, pt reports he uses physical exercise, creating a routine and deep breathing exercises to manage anxiety. Pt demonstrates good insight and continues to work toward achieving treatment goals.  Suicidal/Homicidal:Pt denies plan or intent to harm self or others. Pt encouraged to call 911 or go to closest emergency department in the event of an emergency.  Therapist Response: CSW assessed for changes in mood and daily functioning. CSW reviewed homework assignment(mental health maintenance plan). CSW reviewed with pt self care tips and discussed importance of establishing daily self care routine. CSW actively listened as pt identified triggers and warning signs that cause anxiety. CSW verbally praised pt for demonstrating good insight and practicing healthy coping methods to manage stressors.  Plan: Return again in 2 weeks.  Diagnosis: Axis I:adjustment disorder with mixed anxiety and depressed mood                                     Work stress    Axis II: No diagnosis    Yvette Rack, LCSW 02/11/2020

## 2020-02-22 ENCOUNTER — Other Ambulatory Visit: Payer: Self-pay

## 2020-02-22 ENCOUNTER — Ambulatory Visit (INDEPENDENT_AMBULATORY_CARE_PROVIDER_SITE_OTHER): Payer: 59 | Admitting: Clinical

## 2020-02-22 DIAGNOSIS — F4323 Adjustment disorder with mixed anxiety and depressed mood: Secondary | ICD-10-CM

## 2020-02-22 DIAGNOSIS — Z566 Other physical and mental strain related to work: Secondary | ICD-10-CM | POA: Diagnosis not present

## 2020-02-22 NOTE — Progress Notes (Signed)
   THERAPIST PROGRESS NOTE  Session Time: 10am  Participation Level: Active  Behavioral Response: Unknown appearanceAlertPleasant  Type of Therapy: Individual Therapy  Treatment Goals addressed:Stress management  Interventions: Other: stress management  Virtual Visit via Video Note  I connected with Gregory Holmes on 02/22/20 at 10:00 AM EST by a video enabled telemedicine application and verified that I am speaking with the correct person using two identifiers.  Location: Patient: Home Provider: [place] D[place]   I discussed the limitations of evaluation and management by telemedicine and the availability of in person appointments. The patient expressed understanding and agreed to proceed.   I discussed the assessment and treatment plan with the patient. The patient was provided an opportunity to ask questions and all were answered. The patient agreed with the plan and demonstrated an understanding of the instructions.   The patient was advised to call back or seek an in-person evaluation if the symptoms worsen or if the condition fails to improve as anticipated.  I provided 45 minutes of non-face-to-face time during this encounter.  Summary: Gregory Holmes is a 40 y.o. male who presents in a pleasant mood. Pt request virtual session today due to being in quarantine. Pt says things are improving in his marriage and says he is finding healthy ways to manage work related stress. Pt demonstrates good insight. Pt discussed doing well with addressing his basic needs during periods of high stress. Pt states he exercises, maintains healthy diet and is making necessary lifestyle changes to improve overall well being. Pt is making progress toward his treatment goals of reducing anxiety and implementing healthy coping methods.  Suicidal/Homicidal: Pt denies plan or intent to harm self or others. Pt encouraged to call 911 or go to closest emergency department in the event of an  emergency.  Therapist Response: CSW assessed for changes in daily functioning, mood and behavior. CSW provided pt with stress management strategies. CSW probed for feedback on sxs pt experiences in response to stress. CSW processed with pt differences between eustress and distress. CSW provided active listening as pt identified social supports(wife and youngest daughter) who assist him in managing stressors. CSW prompted pt to identify habits or tendencies(avoidance, lashing out) he displays when he experiences unpleasant emotions. Pt identified negative consequences he has received from demonstrating behavior.   Plan: Return again in 2 weeks.  Diagnosis: Axis I: adjustment disorder with mixed anxiety and depressed mood Work stress    Axis II: No diagnosis    Yvette Rack, LCSW 02/22/2020

## 2020-02-26 DIAGNOSIS — C8100 Nodular lymphocyte predominant Hodgkin lymphoma, unspecified site: Secondary | ICD-10-CM | POA: Diagnosis not present

## 2020-02-26 DIAGNOSIS — Z48298 Encounter for aftercare following other organ transplant: Secondary | ICD-10-CM | POA: Diagnosis not present

## 2020-02-26 DIAGNOSIS — Z8572 Personal history of non-Hodgkin lymphomas: Secondary | ICD-10-CM | POA: Diagnosis not present

## 2020-02-26 DIAGNOSIS — F419 Anxiety disorder, unspecified: Secondary | ICD-10-CM | POA: Diagnosis not present

## 2020-02-26 DIAGNOSIS — F32A Depression, unspecified: Secondary | ICD-10-CM | POA: Diagnosis not present

## 2020-02-26 DIAGNOSIS — Z9484 Stem cells transplant status: Secondary | ICD-10-CM | POA: Diagnosis not present

## 2020-03-02 ENCOUNTER — Other Ambulatory Visit (HOSPITAL_BASED_OUTPATIENT_CLINIC_OR_DEPARTMENT_OTHER): Payer: Self-pay | Admitting: Dermatology

## 2020-03-02 DIAGNOSIS — L820 Inflamed seborrheic keratosis: Secondary | ICD-10-CM | POA: Diagnosis not present

## 2020-03-02 DIAGNOSIS — L0109 Other impetigo: Secondary | ICD-10-CM | POA: Diagnosis not present

## 2020-03-02 DIAGNOSIS — D485 Neoplasm of uncertain behavior of skin: Secondary | ICD-10-CM | POA: Diagnosis not present

## 2020-03-02 MED FILL — DOXYCYCLINE MONOHYDRATE 100: 100 | 14 days supply | Qty: 28 | Fill #0

## 2020-03-02 MED FILL — TRIAMCINOLONE 0.1% OINTMENT: 0.1 | 30 days supply | Qty: 80 | Fill #0

## 2020-03-11 ENCOUNTER — Other Ambulatory Visit (HOSPITAL_COMMUNITY): Payer: Self-pay | Admitting: Family Medicine

## 2020-03-11 MED FILL — buPROPion HCL ER (XL) 150 M: 150 | 30 days supply | Qty: 30 | Fill #0 | Status: TO

## 2020-03-11 MED FILL — buPROPion HCL ER (XL) 150 M: 150 | 30 days supply | Qty: 30 | Fill #0

## 2020-03-18 DIAGNOSIS — F418 Other specified anxiety disorders: Secondary | ICD-10-CM | POA: Diagnosis not present

## 2020-03-25 DIAGNOSIS — J209 Acute bronchitis, unspecified: Secondary | ICD-10-CM | POA: Diagnosis not present

## 2020-04-07 MED FILL — buPROPion HCL ER (XL) 150 M: 150 | 30 days supply | Qty: 30 | Fill #1

## 2020-04-13 ENCOUNTER — Other Ambulatory Visit: Payer: Self-pay

## 2020-04-13 ENCOUNTER — Ambulatory Visit (INDEPENDENT_AMBULATORY_CARE_PROVIDER_SITE_OTHER): Payer: 59 | Admitting: Clinical

## 2020-04-13 DIAGNOSIS — F4323 Adjustment disorder with mixed anxiety and depressed mood: Secondary | ICD-10-CM | POA: Diagnosis not present

## 2020-04-13 DIAGNOSIS — Z566 Other physical and mental strain related to work: Secondary | ICD-10-CM | POA: Diagnosis not present

## 2020-04-13 NOTE — Progress Notes (Signed)
   THERAPIST PROGRESS NOTE  Session Time: 10am  Participation Level: Active  Behavioral Response: CasualAlertEuthymic  Type of Therapy: Individual Therapy  Treatment Goals addressed: Anxiety and Coping  Interventions: DBT Virtual Visit via Video Note  I connected with Gregory Holmes on 04/13/20 at 10:00 AM EDT by a video enabled telemedicine application and verified that I am speaking with the correct person using two identifiers.  Location: Patient: home Provider: office   I discussed the limitations of evaluation and management by telemedicine and the availability of in person appointments. The patient expressed understanding and agreed to proceed.   I discussed the assessment and treatment plan with the patient. The patient was provided an opportunity to ask questions and all were answered. The patient agreed with the plan and demonstrated an understanding of the instructions.   The patient was advised to call back or seek an in-person evaluation if the symptoms worsen or if the condition fails to improve as anticipated.  I provided 40 minutes of non-face-to-face time during this encounter. Summary: Gregory Holmes is a 40 y.o. male who presents in a euthymic mood. Pt reports his mood as being "pretty good" pt states he recently received a promotion at work and continues to implement self care plan. Pt states his change in work schedule will allow an opportunity for more free time to complete self care. Additionally, pt states he has found success in maintaining a schedule and exercising routinely. Pt also reports he has been able to stay connected to family and friends. Improvement in mood and progress toward achieving treatment goals reported.  Suicidal/Homicidal: Pt denies plan or intent to harm self or others, no SI/HI reported.  Therapist Response: CSW assessed for changes in mood, behavior and daily functioning. CSW utilized DBT to introduce distress tolerance skills to pt.  CSW discussed with pt radical acceptance, ACCEPTS and self soothing with 5 sense. Pt demonstrates good insight and understanding of subject matter. Pt encouraged to practice skill and will be further discussed next session.  Plan: Return again in 2 weeks.  Diagnosis: Axis I: adjustment disorder with mixed anxiety and depressed mood    Work related stress    Axis II: No diagnosis    Yvette Rack, LCSW 04/13/2020

## 2020-04-20 ENCOUNTER — Other Ambulatory Visit (HOSPITAL_BASED_OUTPATIENT_CLINIC_OR_DEPARTMENT_OTHER): Payer: Self-pay

## 2020-05-06 ENCOUNTER — Other Ambulatory Visit (HOSPITAL_COMMUNITY): Payer: Self-pay

## 2020-07-03 IMAGING — DX DG ANKLE COMPLETE 3+V*L*
3 series · 3 of 3 positions shown · non-contrast
Comparison: None.

CLINICAL DATA: NKI, stem cell transplant 8 years ago, left lateral
ankle pain x 3 years, pt Marsed he has chronic pain in both ankles from
playing basketball.

EXAM:
LEFT ANKLE COMPLETE - 3+ VIEW

[ankle ap]
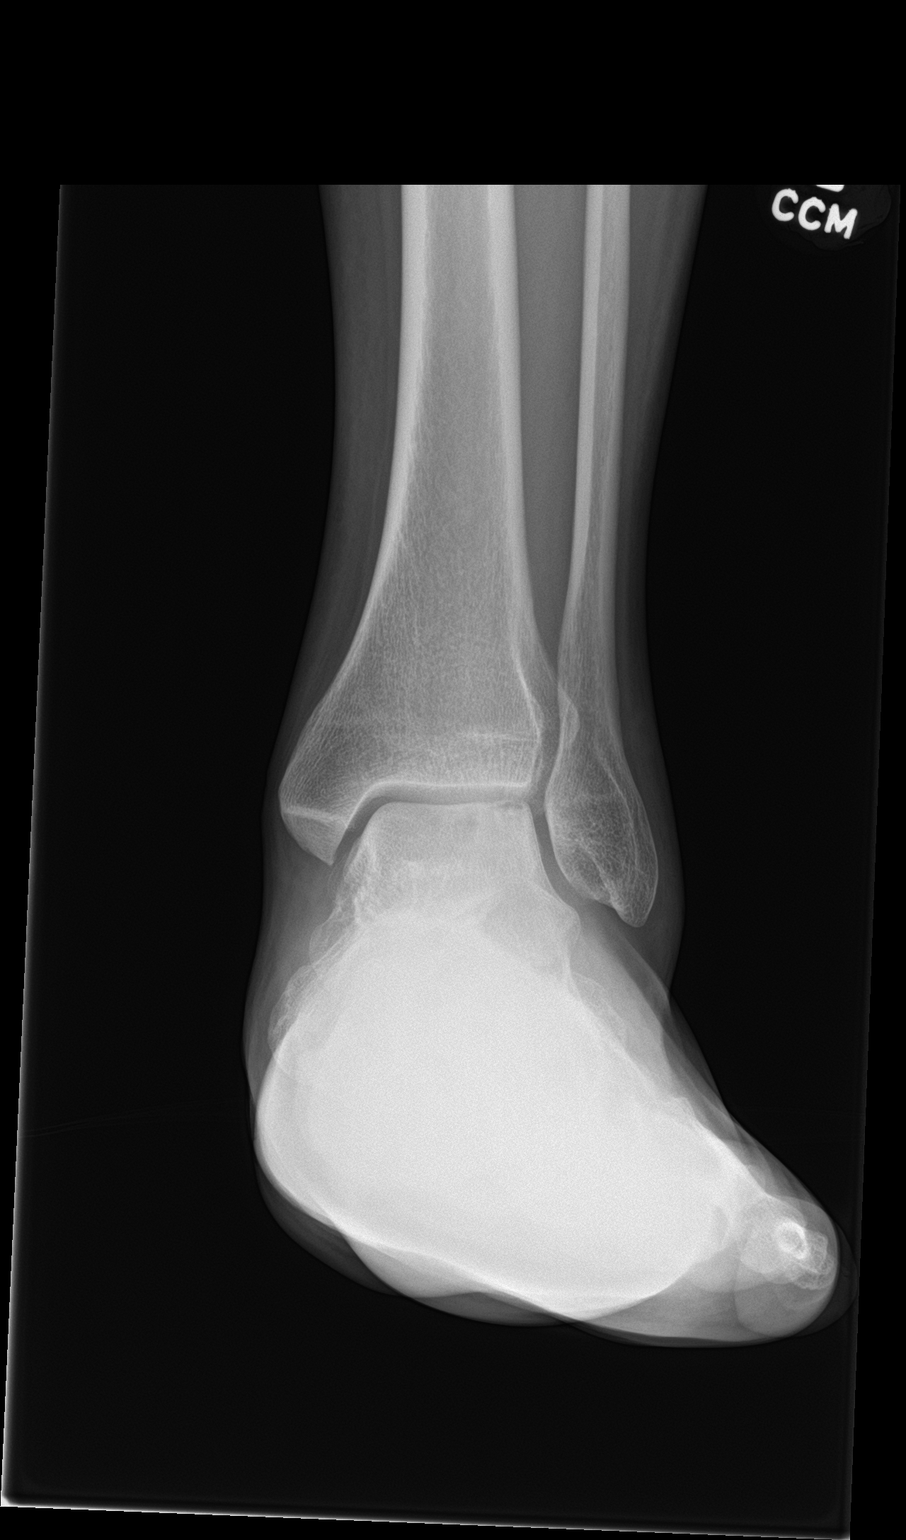

[ankle obl]
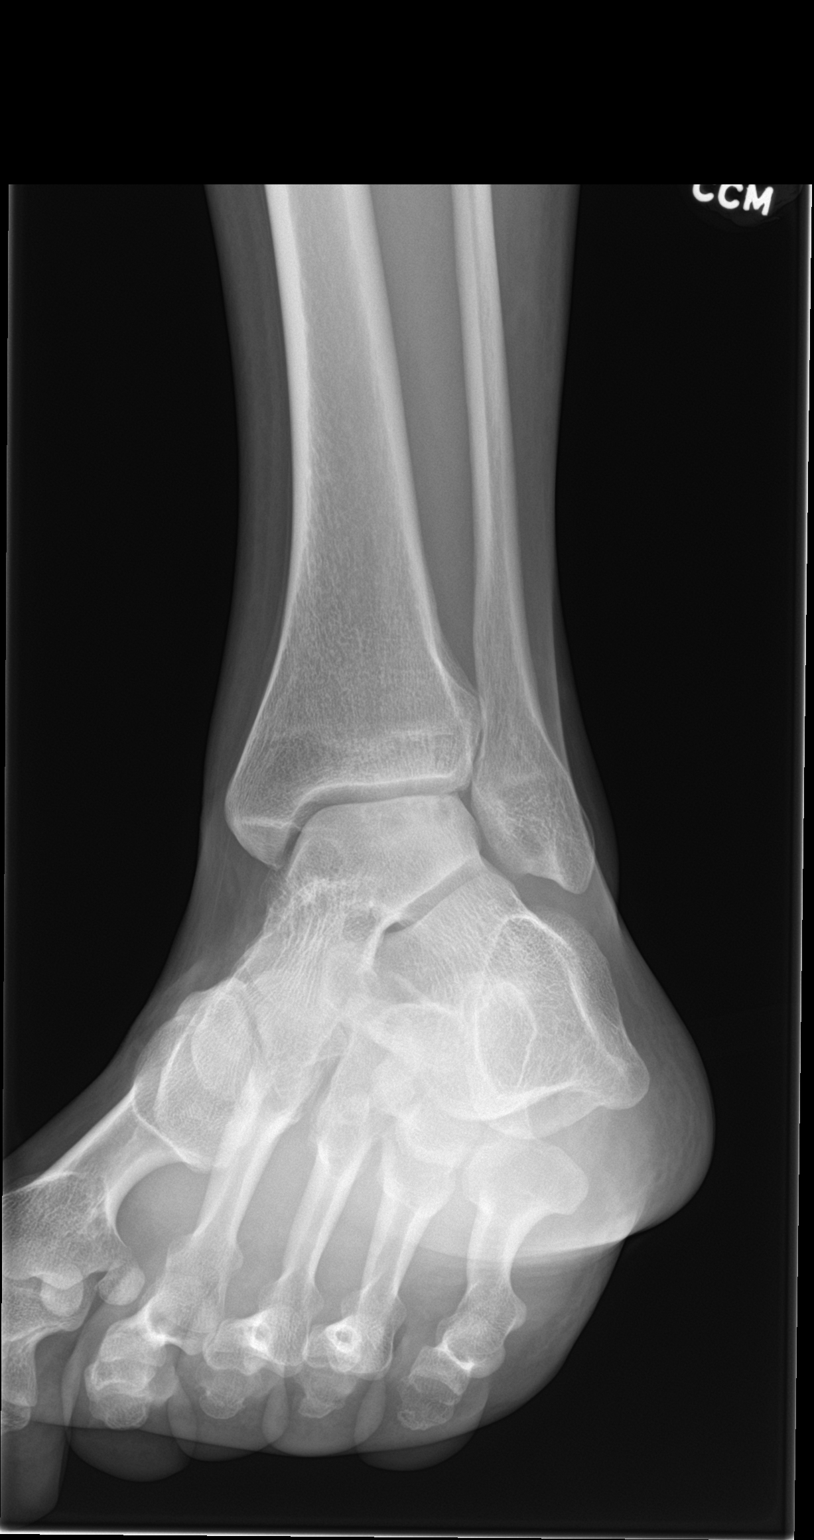

[ankle lat]
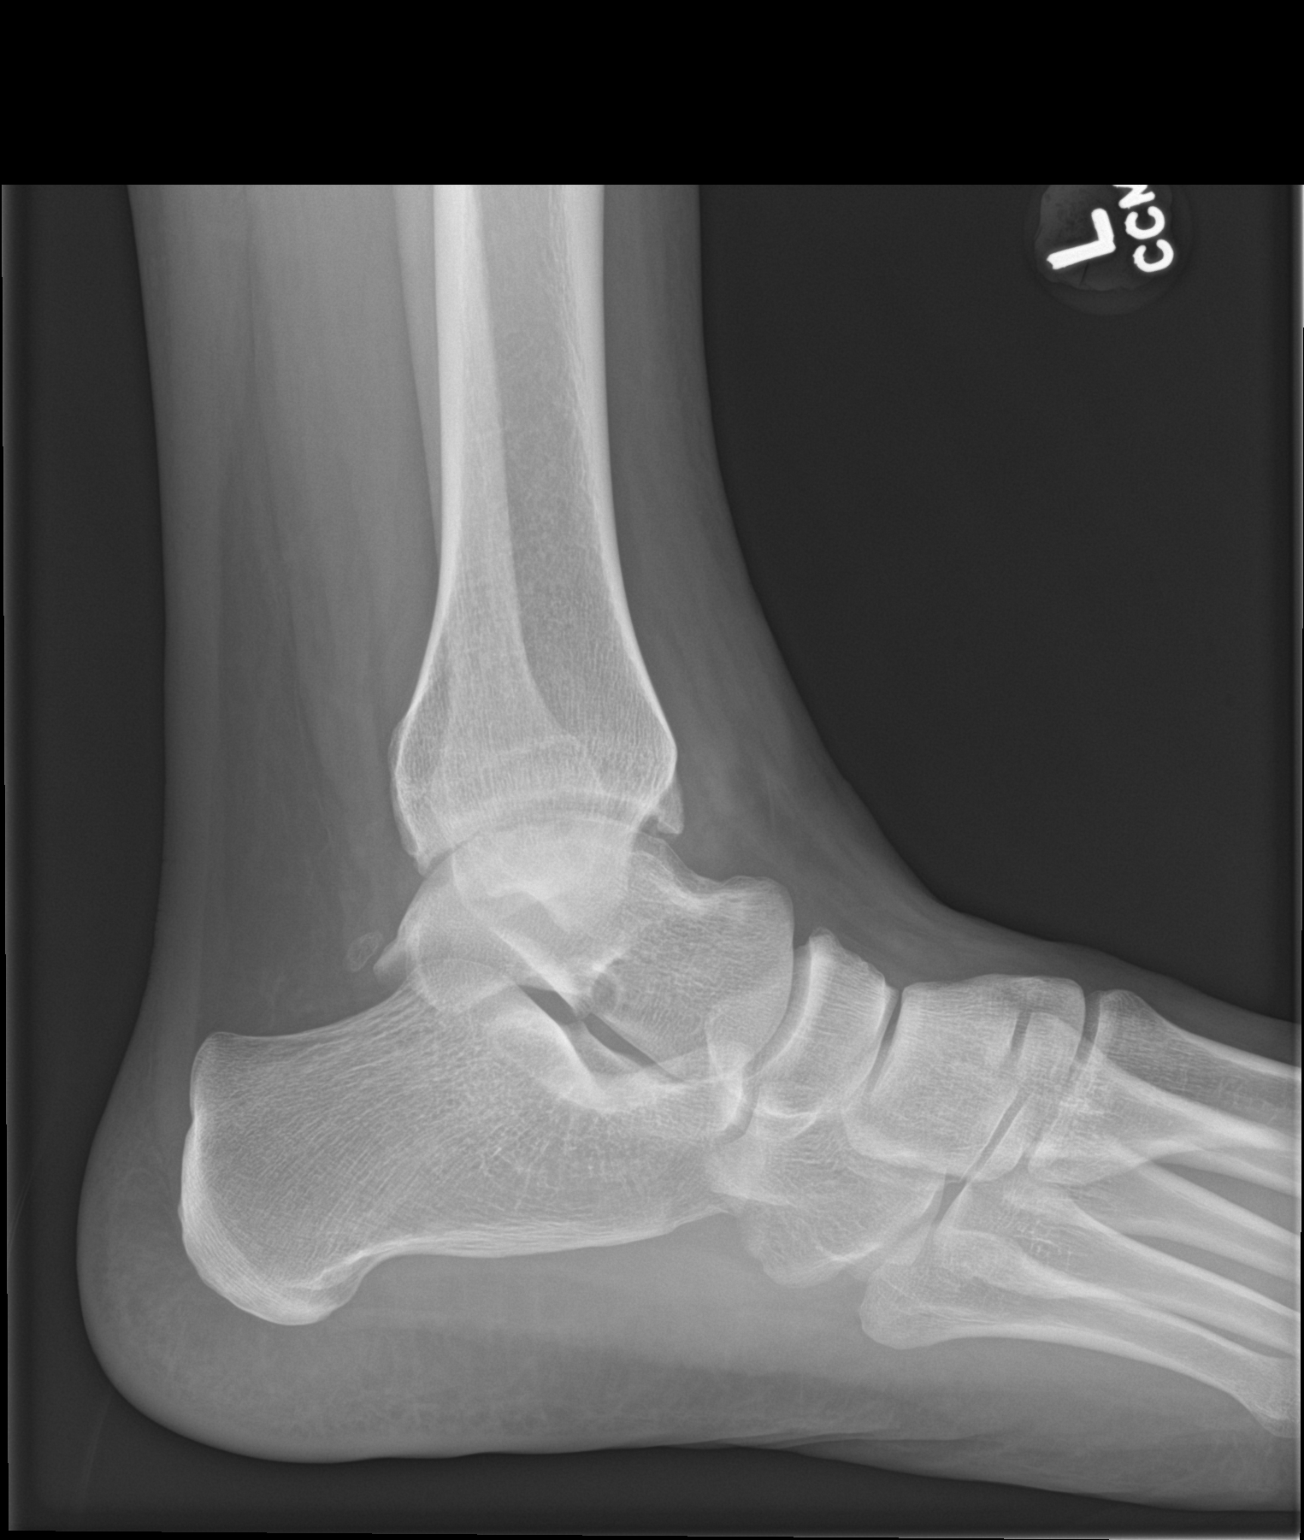

[3 of 3 positions shown; findings below may reference images not displayed]

FINDINGS: There is mild soft tissue swelling along the LATERAL aspect of the
ankle. No associated fracture. No subluxation. No radiopaque foreign
body or soft tissue gas.
IMPRESSION: Mild soft tissue swelling.

## 2020-07-05 DIAGNOSIS — N5201 Erectile dysfunction due to arterial insufficiency: Secondary | ICD-10-CM | POA: Diagnosis not present

## 2020-07-27 DIAGNOSIS — R1084 Generalized abdominal pain: Secondary | ICD-10-CM | POA: Diagnosis not present

## 2020-07-27 DIAGNOSIS — N201 Calculus of ureter: Secondary | ICD-10-CM | POA: Diagnosis not present

## 2020-07-27 DIAGNOSIS — N132 Hydronephrosis with renal and ureteral calculous obstruction: Secondary | ICD-10-CM | POA: Diagnosis not present

## 2020-08-04 DIAGNOSIS — N201 Calculus of ureter: Secondary | ICD-10-CM | POA: Diagnosis not present

## 2020-08-04 DIAGNOSIS — N139 Obstructive and reflux uropathy, unspecified: Secondary | ICD-10-CM | POA: Diagnosis not present

## 2020-08-12 ENCOUNTER — Other Ambulatory Visit: Payer: Self-pay | Admitting: Urology

## 2020-08-12 DIAGNOSIS — N201 Calculus of ureter: Secondary | ICD-10-CM

## 2020-08-18 ENCOUNTER — Encounter (HOSPITAL_BASED_OUTPATIENT_CLINIC_OR_DEPARTMENT_OTHER): Admission: RE | Payer: Self-pay | Source: Home / Self Care

## 2020-08-18 ENCOUNTER — Ambulatory Visit (HOSPITAL_BASED_OUTPATIENT_CLINIC_OR_DEPARTMENT_OTHER): Admission: RE | Admit: 2020-08-18 | Payer: 59 | Source: Home / Self Care | Admitting: Urology

## 2020-08-18 SURGERY — LITHOTRIPSY, ESWL
Anesthesia: LOCAL | Laterality: Right

## 2020-08-19 ENCOUNTER — Other Ambulatory Visit: Payer: Self-pay

## 2020-08-19 ENCOUNTER — Ambulatory Visit (HOSPITAL_COMMUNITY)
Admission: RE | Admit: 2020-08-19 | Discharge: 2020-08-19 | Disposition: A | Discharge status: Home / Self Care | Payer: 59 | Source: Ambulatory Visit | Attending: Adult Health | Admitting: Adult Health

## 2020-08-19 ENCOUNTER — Other Ambulatory Visit (HOSPITAL_COMMUNITY): Payer: Self-pay | Admitting: Adult Health

## 2020-08-19 DIAGNOSIS — N201 Calculus of ureter: Secondary | ICD-10-CM

## 2020-09-22 ENCOUNTER — Other Ambulatory Visit (HOSPITAL_BASED_OUTPATIENT_CLINIC_OR_DEPARTMENT_OTHER): Payer: Self-pay

## 2020-09-22 MED ORDER — BUPROPION HCL ER (XL) 150 MG PO TB24
ORAL_TABLET | ORAL | 5 refills | Status: DC
  Filled 2020-09-22: qty 30, 30d supply, fill #0
  Filled 2020-10-19: qty 30, 30d supply, fill #1
  Filled 2020-10-20: qty 30, 30d supply, fill #0
  Filled 2020-11-17: qty 30, 30d supply, fill #1
  Filled 2020-12-13: qty 30, 30d supply, fill #2
  Filled 2021-01-17 – 2021-01-20 (×2): qty 30, 30d supply, fill #3
  Filled 2021-02-20: qty 30, 30d supply, fill #4
  Filled 2021-02-21: qty 30, 30d supply, fill #0

## 2020-09-23 ENCOUNTER — Other Ambulatory Visit (HOSPITAL_BASED_OUTPATIENT_CLINIC_OR_DEPARTMENT_OTHER): Payer: Self-pay

## 2020-10-20 ENCOUNTER — Other Ambulatory Visit (HOSPITAL_COMMUNITY): Payer: Self-pay

## 2020-10-20 ENCOUNTER — Other Ambulatory Visit (HOSPITAL_BASED_OUTPATIENT_CLINIC_OR_DEPARTMENT_OTHER): Payer: Self-pay

## 2020-10-21 ENCOUNTER — Other Ambulatory Visit (HOSPITAL_BASED_OUTPATIENT_CLINIC_OR_DEPARTMENT_OTHER): Payer: Self-pay

## 2020-11-17 ENCOUNTER — Other Ambulatory Visit (HOSPITAL_COMMUNITY): Payer: Self-pay

## 2020-11-18 ENCOUNTER — Other Ambulatory Visit (HOSPITAL_COMMUNITY): Payer: Self-pay

## 2020-12-14 ENCOUNTER — Other Ambulatory Visit (HOSPITAL_COMMUNITY): Payer: Self-pay

## 2021-01-17 ENCOUNTER — Other Ambulatory Visit (HOSPITAL_COMMUNITY): Payer: Self-pay

## 2021-01-20 ENCOUNTER — Other Ambulatory Visit (HOSPITAL_COMMUNITY): Payer: Self-pay

## 2021-02-06 DIAGNOSIS — L0291 Cutaneous abscess, unspecified: Secondary | ICD-10-CM | POA: Diagnosis not present

## 2021-02-21 ENCOUNTER — Other Ambulatory Visit (HOSPITAL_COMMUNITY): Payer: Self-pay

## 2021-03-20 ENCOUNTER — Other Ambulatory Visit (HOSPITAL_COMMUNITY): Payer: Self-pay

## 2021-03-20 MED ORDER — BUPROPION HCL ER (XL) 150 MG PO TB24
150.0000 mg | ORAL_TABLET | Freq: Every day | ORAL | 5 refills | Status: AC
  Filled 2021-03-20: qty 30, 30d supply, fill #0
  Filled 2021-04-18: qty 30, 30d supply, fill #1
  Filled 2021-05-20: qty 30, 30d supply, fill #2
  Filled 2021-06-23: qty 30, 30d supply, fill #3

## 2021-03-21 ENCOUNTER — Other Ambulatory Visit (HOSPITAL_COMMUNITY): Payer: Self-pay

## 2021-04-18 ENCOUNTER — Other Ambulatory Visit (HOSPITAL_COMMUNITY): Payer: Self-pay

## 2021-04-19 ENCOUNTER — Other Ambulatory Visit (HOSPITAL_COMMUNITY): Payer: Self-pay

## 2021-05-20 ENCOUNTER — Other Ambulatory Visit (HOSPITAL_COMMUNITY): Payer: Self-pay

## 2021-06-23 ENCOUNTER — Other Ambulatory Visit (HOSPITAL_COMMUNITY): Payer: Self-pay

## 2021-06-27 ENCOUNTER — Other Ambulatory Visit (HOSPITAL_COMMUNITY): Payer: Self-pay

## 2021-07-26 ENCOUNTER — Other Ambulatory Visit (HOSPITAL_COMMUNITY): Payer: Self-pay

## 2021-08-25 ENCOUNTER — Other Ambulatory Visit: Payer: Self-pay

## 2021-08-25 ENCOUNTER — Emergency Department (HOSPITAL_BASED_OUTPATIENT_CLINIC_OR_DEPARTMENT_OTHER)
Admission: EM | Admit: 2021-08-25 | Discharge: 2021-08-25 | Disposition: A | Discharge status: Home / Self Care | Payer: Commercial Managed Care - PPO | Attending: Emergency Medicine | Admitting: Emergency Medicine

## 2021-08-25 ENCOUNTER — Encounter (HOSPITAL_BASED_OUTPATIENT_CLINIC_OR_DEPARTMENT_OTHER): Payer: Self-pay | Admitting: Emergency Medicine

## 2021-08-25 ENCOUNTER — Emergency Department (HOSPITAL_BASED_OUTPATIENT_CLINIC_OR_DEPARTMENT_OTHER): Payer: Commercial Managed Care - PPO

## 2021-08-25 DIAGNOSIS — N132 Hydronephrosis with renal and ureteral calculous obstruction: Secondary | ICD-10-CM | POA: Insufficient documentation

## 2021-08-25 DIAGNOSIS — R109 Unspecified abdominal pain: Secondary | ICD-10-CM | POA: Diagnosis present

## 2021-08-25 DIAGNOSIS — N200 Calculus of kidney: Secondary | ICD-10-CM

## 2021-08-25 LAB — CBC WITH DIFFERENTIAL/PLATELET
Abs Immature Granulocytes: 0.05 10*3/uL (ref 0.00–0.07)
Basophils Absolute: 0 10*3/uL (ref 0.0–0.1)
Basophils Relative: 0 %
Eosinophils Absolute: 0.1 10*3/uL (ref 0.0–0.5)
Eosinophils Relative: 1 %
HCT: 38 % — ABNORMAL LOW (ref 39.0–52.0)
Hemoglobin: 12.9 g/dL — ABNORMAL LOW (ref 13.0–17.0)
Immature Granulocytes: 1 %
Lymphocytes Relative: 13 %
Lymphs Abs: 1.3 10*3/uL (ref 0.7–4.0)
MCH: 30.5 pg (ref 26.0–34.0)
MCHC: 33.9 g/dL (ref 30.0–36.0)
MCV: 89.8 fL (ref 80.0–100.0)
Monocytes Absolute: 1.1 10*3/uL — ABNORMAL HIGH (ref 0.1–1.0)
Monocytes Relative: 11 %
Neutro Abs: 7.4 10*3/uL (ref 1.7–7.7)
Neutrophils Relative %: 74 %
Platelets: 239 10*3/uL (ref 150–400)
RBC: 4.23 MIL/uL (ref 4.22–5.81)
RDW: 13 % (ref 11.5–15.5)
WBC: 10 10*3/uL (ref 4.0–10.5)
nRBC: 0 % (ref 0.0–0.2)

## 2021-08-25 LAB — BASIC METABOLIC PANEL
Anion gap: 7 (ref 5–15)
BUN: 22 mg/dL — ABNORMAL HIGH (ref 6–20)
CO2: 22 mmol/L (ref 22–32)
Calcium: 10.4 mg/dL — ABNORMAL HIGH (ref 8.9–10.3)
Chloride: 106 mmol/L (ref 98–111)
Creatinine, Ser: 1.66 mg/dL — ABNORMAL HIGH (ref 0.61–1.24)
GFR, Estimated: 52 mL/min — ABNORMAL LOW (ref >60)
Glucose, Bld: 104 mg/dL — ABNORMAL HIGH (ref 70–99)
Potassium: 3.8 mmol/L (ref 3.5–5.1)
Sodium: 135 mmol/L (ref 135–145)

## 2021-08-25 MED ORDER — MORPHINE SULFATE (PF) 4 MG/ML IV SOLN
4.0000 mg | Freq: Once | INTRAVENOUS | Status: AC
  Administered 2021-08-25: 4 mg via INTRAVENOUS
  Filled 2021-08-25: qty 1

## 2021-08-25 MED ORDER — KETOROLAC TROMETHAMINE 30 MG/ML IJ SOLN
30.0000 mg | Freq: Once | INTRAMUSCULAR | Status: AC
  Administered 2021-08-25: 30 mg via INTRAVENOUS
  Filled 2021-08-25: qty 1

## 2021-08-25 MED ORDER — ONDANSETRON 4 MG PO TBDP
4.0000 mg | ORAL_TABLET | Freq: Once | ORAL | Status: AC
  Administered 2021-08-25: 4 mg via ORAL
  Filled 2021-08-25: qty 1

## 2021-08-25 MED ORDER — ONDANSETRON HCL 4 MG/2ML IJ SOLN
4.0000 mg | Freq: Once | INTRAMUSCULAR | Status: AC
  Administered 2021-08-25: 4 mg via INTRAVENOUS
  Filled 2021-08-25: qty 2

## 2021-08-25 MED ORDER — OXYCODONE-ACETAMINOPHEN 5-325 MG PO TABS: 1.0000 | ORAL_TABLET | Freq: Four times a day (QID) | ORAL | 0 refills | Status: AC | PRN

## 2021-08-25 NOTE — Discharge Instructions (Signed)
Take Percocet as prescribed as needed for pain.  Continue taking Cipro and Flomax as previously prescribed.  Follow-up with your urologist if the stone is not passing through the weekend.  Return to the ER if you develop high fever, worsening pain, or for other new and concerning symptoms.

## 2021-08-25 NOTE — ED Triage Notes (Signed)
Pt POV c/o right flank pain starting today. Seen at [facility] for kidney stone today, got toradol shot, cipro, flomax. Reports poor pain control.  Pt also concerned he has had little urine output since 1730.

## 2021-08-25 NOTE — ED Provider Notes (Signed)
[place] EMERGENCY DEPARTMENT Provider Note   CSN: [id number] Arrival date & time: 08/25/21  0020     History  Chief Complaint  Patient presents with   Flank Pain    Gregory Holmes is a 41 y.o. male.  Patient is a 41 year old male with past medical history of prior renal calculi, GERD.  Patient presenting today with complaints of right flank pain.  He was seen at urgent care and was told he likely had a kidney stone.  He apparently had blood and infection in his urine.  He was prescribed Flomax, and antibiotic, and discharged.  Pain worse this evening.  He denies any fevers or chills.  He denies any bowel complaints.  Last episode of having had a kidney stone was over 1 year ago.  The history is provided by the patient.       Home Medications Prior to Admission medications   Medication Sig Start Date End Date Taking? Authorizing Provider  buPROPion (WELLBUTRIN XL) 150 MG 24 hr tablet TAKE 1 TABLET BY MOUTH ONCE EVERY 24 HOURS 03/11/20 03/11/21  Enid Skeens., MD  buPROPion (WELLBUTRIN XL) 150 MG 24 hr tablet TAKE ONE TABLET BY MOUTH DAILY 10/28/19 10/27/20  Enid Skeens., MD  buPROPion (WELLBUTRIN XL) 150 MG 24 hr tablet Take 1 tablet (150 mg total) by mouth daily. 03/20/21     Cholecalciferol (VITAMIN D-1000 MAX ST) 25 MCG (1000 UT) tablet Take by mouth.    [provider]  clotrimazole-betamethasone (LOTRISONE) cream  06/09/18   [provider]  escitalopram (LEXAPRO) 20 MG tablet Take 1 tablet (20 mg total) by mouth daily. 09/04/18   Luetta Nutting, DO  famotidine (PEPCID) 20 MG tablet Take 1 tablet (20 mg total) by mouth 2 (two) times daily. 12/20/17   Luetta Nutting, DO  fluticasone (FLONASE) 50 MCG/ACT nasal spray Place 2 sprays into both nostrils daily. 12/20/17   Luetta Nutting, DO  levocetirizine (XYZAL) 5 MG tablet Take 1 tablet (5 mg total) by mouth every evening. Patient not taking: Reported on 09/04/2018 12/20/17   Luetta Nutting, DO   varenicline (CHANTIX STARTING MONTH PAK) 0.5 MG X 11 & 1 MG X 42 tablet Take one 0.5 mg tab po once daily for 3 days, then increase to one 0.5 mg tab BID for 4 days, then increase to one 1 mg tab BID 11/26/18   Luetta Nutting, DO      Allergies    Patient has no known allergies.    Review of Systems   Review of Systems  All other systems reviewed and are negative.   Physical Exam Updated Vital Signs BP 131/80   Pulse 75   Temp 98.1 F (36.7 C) (Oral)   Resp 15   Ht '6\' 1"'$  (1.854 m)   Wt 90.7 kg   SpO2 99%   BMI 26.39 kg/m  Physical Exam Vitals and nursing note reviewed.  Constitutional:      General: He is not in acute distress.    Appearance: He is well-developed. He is not diaphoretic.  HENT:     Head: Normocephalic and atraumatic.  Cardiovascular:     Rate and Rhythm: Normal rate and regular rhythm.     Heart sounds: No murmur heard.    No friction rub.  Pulmonary:     Effort: Pulmonary effort is normal. No respiratory distress.     Breath sounds: Normal breath sounds. No wheezing or rales.  Abdominal:     General: Bowel  sounds are normal. There is no distension.     Palpations: Abdomen is soft.     Tenderness: There is no abdominal tenderness. There is right CVA tenderness. There is no left CVA tenderness, guarding or rebound.  Musculoskeletal:        General: Normal range of motion.     Cervical back: Normal range of motion and neck supple.  Skin:    General: Skin is warm and dry.  Neurological:     Mental Status: He is alert and oriented to person, place, and time.     Coordination: Coordination normal.     ED Results / Procedures / Treatments   Labs (all labs ordered are listed, but only abnormal results are displayed) Labs Reviewed  URINALYSIS, ROUTINE W REFLEX MICROSCOPIC    EKG None  Radiology No results found.  Procedures Procedures    Medications Ordered in ED Medications  ondansetron (ZOFRAN) injection 4 mg (has no administration in  time range)  ketorolac (TORADOL) 30 MG/ML injection 30 mg (has no administration in time range)  morphine (PF) 4 MG/ML injection 4 mg (has no administration in time range)  ondansetron (ZOFRAN-ODT) disintegrating tablet 4 mg (4 mg Oral Given 08/25/21 0036)    ED Course/ Medical Decision Making/ A&P  Patient presenting with complaints of right flank pain caused by a 7 mm proximal ureteral stone noted on today's CT scan.  Patient given medication for pain here in the ER and seems to be feeling better.  He has no fever, no white count, and is nontoxic in appearance.  I feel as though he can safely be discharged with pain medication and as needed follow-up with urology.    Final Clinical Impression(s) / ED Diagnoses Final diagnoses:  None    Rx / DC Orders ED Discharge Orders     None         Veryl Speak, MD 08/25/21 [phone]

## 2021-08-26 ENCOUNTER — Encounter (HOSPITAL_BASED_OUTPATIENT_CLINIC_OR_DEPARTMENT_OTHER): Payer: Self-pay | Admitting: Emergency Medicine

## 2021-08-26 ENCOUNTER — Observation Stay (HOSPITAL_BASED_OUTPATIENT_CLINIC_OR_DEPARTMENT_OTHER)
Admission: EM | Admit: 2021-08-26 | Discharge: 2021-08-27 | Disposition: A | Discharge status: Home / Self Care | Payer: Commercial Managed Care - PPO | Attending: Urology | Admitting: Urology

## 2021-08-26 ENCOUNTER — Emergency Department (HOSPITAL_BASED_OUTPATIENT_CLINIC_OR_DEPARTMENT_OTHER): Payer: Commercial Managed Care - PPO

## 2021-08-26 ENCOUNTER — Other Ambulatory Visit: Payer: Self-pay

## 2021-08-26 DIAGNOSIS — N390 Urinary tract infection, site not specified: Secondary | ICD-10-CM | POA: Diagnosis not present

## 2021-08-26 DIAGNOSIS — N179 Acute kidney failure, unspecified: Secondary | ICD-10-CM | POA: Diagnosis not present

## 2021-08-26 DIAGNOSIS — R509 Fever, unspecified: Secondary | ICD-10-CM | POA: Diagnosis present

## 2021-08-26 DIAGNOSIS — N400 Enlarged prostate without lower urinary tract symptoms: Secondary | ICD-10-CM | POA: Insufficient documentation

## 2021-08-26 DIAGNOSIS — Z79899 Other long term (current) drug therapy: Secondary | ICD-10-CM | POA: Insufficient documentation

## 2021-08-26 DIAGNOSIS — F17210 Nicotine dependence, cigarettes, uncomplicated: Secondary | ICD-10-CM | POA: Diagnosis not present

## 2021-08-26 DIAGNOSIS — N132 Hydronephrosis with renal and ureteral calculous obstruction: Principal | ICD-10-CM | POA: Insufficient documentation

## 2021-08-26 DIAGNOSIS — N201 Calculus of ureter: Secondary | ICD-10-CM | POA: Diagnosis present

## 2021-08-26 MED ORDER — LACTATED RINGERS IV BOLUS (SEPSIS)
1000.0000 mL | Freq: Once | INTRAVENOUS | Status: AC
  Administered 2021-08-27: 1000 mL via INTRAVENOUS

## 2021-08-26 MED ORDER — HYDROMORPHONE HCL 1 MG/ML IJ SOLN
1.0000 mg | Freq: Once | INTRAMUSCULAR | Status: AC
  Administered 2021-08-27: 1 mg via INTRAVENOUS
  Filled 2021-08-26: qty 1

## 2021-08-26 MED ORDER — ONDANSETRON HCL 4 MG/2ML IJ SOLN
4.0000 mg | Freq: Once | INTRAMUSCULAR | Status: AC
  Administered 2021-08-27: 4 mg via INTRAVENOUS
  Filled 2021-08-26: qty 2

## 2021-08-26 MED ORDER — ACETAMINOPHEN 325 MG PO TABS
650.0000 mg | ORAL_TABLET | Freq: Once | ORAL | Status: AC
  Administered 2021-08-27: 650 mg via ORAL
  Filled 2021-08-26: qty 2

## 2021-08-26 MED ORDER — SODIUM CHLORIDE 0.9 % IV SOLN
1.0000 g | Freq: Once | INTRAVENOUS | Status: AC
  Administered 2021-08-27: 1 g via INTRAVENOUS
  Filled 2021-08-26: qty 10

## 2021-08-26 NOTE — ED Triage Notes (Signed)
Pt seen here yesterday and dx with renal calculus. Pt returned due to temp at home of 99.9, and states his urologist was concerned for sepsis.

## 2021-08-26 NOTE — ED Provider Notes (Signed)
[place] EMERGENCY DEPARTMENT Provider Note   CSN: [id number] Arrival date & time: 08/26/21  2322     History {Add pertinent medical, surgical, social history, OB history to HPI:1} Chief Complaint  Patient presents with   Fever    MARV ALFREY is a 41 y.o. male.  The history is provided by the patient and medical records.  Fever BRALEN WILTGEN is a 41 y.o. male who presents to the Emergency Department complaining of ***  Chills tonight, temp 99 at home  9p percocet 1  Thursday right flank/rlq.  Dull/sharp. Constant  No v.  Has nausea.  Urinating ok no dysuria.   Cipro for uti.  Seen at [facility] in [place].  dx flomax zofran        Home Medications Prior to Admission medications   Medication Sig Start Date End Date Taking? Authorizing Provider  buPROPion (WELLBUTRIN XL) 150 MG 24 hr tablet TAKE 1 TABLET BY MOUTH ONCE EVERY 24 HOURS 03/11/20 03/11/21  Enid Skeens., MD  buPROPion (WELLBUTRIN XL) 150 MG 24 hr tablet TAKE ONE TABLET BY MOUTH DAILY 10/28/19 10/27/20  Enid Skeens., MD  buPROPion (WELLBUTRIN XL) 150 MG 24 hr tablet Take 1 tablet (150 mg total) by mouth daily. 03/20/21     Cholecalciferol (VITAMIN D-1000 MAX ST) 25 MCG (1000 UT) tablet Take by mouth.    [provider]  clotrimazole-betamethasone (LOTRISONE) cream  06/09/18   [provider]  escitalopram (LEXAPRO) 20 MG tablet Take 1 tablet (20 mg total) by mouth daily. 09/04/18   Luetta Nutting, DO  famotidine (PEPCID) 20 MG tablet Take 1 tablet (20 mg total) by mouth 2 (two) times daily. 12/20/17   Luetta Nutting, DO  fluticasone (FLONASE) 50 MCG/ACT nasal spray Place 2 sprays into both nostrils daily. 12/20/17   Luetta Nutting, DO  levocetirizine (XYZAL) 5 MG tablet Take 1 tablet (5 mg total) by mouth every evening. Patient not taking: Reported on 09/04/2018 12/20/17   Luetta Nutting, DO  oxyCODONE-acetaminophen (PERCOCET) 5-325 MG tablet Take 1-2 tablets  by mouth every 6 (six) hours as needed. 08/25/21   Veryl Speak, MD  varenicline (CHANTIX STARTING MONTH PAK) 0.5 MG X 11 & 1 MG X 42 tablet Take one 0.5 mg tab po once daily for 3 days, then increase to one 0.5 mg tab BID for 4 days, then increase to one 1 mg tab BID 11/26/18   Luetta Nutting, DO      Allergies    Patient has no known allergies.    Review of Systems   Review of Systems  Constitutional:  Positive for fever.  All other systems reviewed and are negative.   Physical Exam Updated Vital Signs BP 132/83 (BP Location: Left Arm)   Pulse 97   Temp 100.1 F (37.8 C) (Oral)   Resp 17   Ht '6\' 1"'$  (1.854 m)   Wt 90.7 kg   SpO2 96%   BMI 26.39 kg/m  Physical Exam Vitals and nursing note reviewed.  Constitutional:      Appearance: He is well-developed.  HENT:     Head: Normocephalic and atraumatic.  Cardiovascular:     Rate and Rhythm: Regular rhythm. Tachycardia present.     Heart sounds: No murmur heard. Pulmonary:     Effort: Pulmonary effort is normal. No respiratory distress.     Breath sounds: Normal breath sounds.  Abdominal:     Palpations: Abdomen is soft.     Tenderness:  There is right CVA tenderness. There is no guarding or rebound.     Comments: Mild rlq tenderness  Musculoskeletal:        General: No tenderness.  Skin:    General: Skin is warm and dry.  Neurological:     Mental Status: He is alert and oriented to person, place, and time.  Psychiatric:        Behavior: Behavior normal.     ED Results / Procedures / Treatments   Labs (all labs ordered are listed, but only abnormal results are displayed) Labs Reviewed - No data to display  EKG None  Radiology CT Renal Stone Study  Result Date: 08/25/2021 CLINICAL DATA:  Right flank pain, decreased urine output, kidney stone EXAM: CT ABDOMEN AND PELVIS WITHOUT CONTRAST TECHNIQUE: Multidetector CT imaging of the abdomen and pelvis was performed following the standard protocol without IV  contrast. RADIATION DOSE REDUCTION: This exam was performed according to the departmental dose-optimization program which includes automated exposure control, adjustment of the mA and/or kV according to patient size and/or use of iterative reconstruction technique. COMPARISON:  07/27/2020 FINDINGS: Lower chest: Lung bases are clear. Hepatobiliary: Unenhanced liver is unremarkable. Gallbladder is underdistended. No intrahepatic or extrahepatic duct dilatation. Pancreas: Within normal limits. Spleen: Within normal limits mid Adrenals/Urinary Tract: Adrenal glands are within normal limits Left kidney is within normal limits. Right kidney is notable for mild right hydroureteronephrosis. Associated 7 mm proximal right ureteral calculus at the L3-4 level. Bladder is within normal limits. Stomach/Bowel: Stomach is within normal limits. No evidence of bowel obstruction. Normal appendix (series 2/image 60). No colonic wall thickening or inflammatory changes. Vascular/Lymphatic: No evidence of abdominal aortic aneurysm. No suspicious abdominopelvic lymphadenopathy. Reproductive: Prostate is unremarkable. Other: No abdominopelvic ascites. Musculoskeletal: Visualized osseous structures are within normal limits. IMPRESSION: 7 mm proximal right ureteral calculus at the L3-4 level. Associated mild right hydroureteronephrosis. Electronically Signed   By: Julian Hy M.D.   On: 08/25/2021 01:07    Procedures Procedures  {Document cardiac monitor, telemetry assessment procedure when appropriate:1}  Medications Ordered in ED Medications - No data to display  ED Course/ Medical Decision Making/ A&P                           Medical Decision Making  ***  {Document critical care time when appropriate:1} {Document review of labs and clinical decision tools ie heart score, Chads2Vasc2 etc:1}  {Document your independent review of radiology images, and any outside records:1} {Document your discussion with family  members, caretakers, and with consultants:1} {Document social determinants of health affecting pt's care:1} {Document your decision making why or why not admission, treatments were needed:1} Final Clinical Impression(s) / ED Diagnoses Final diagnoses:  None    Rx / DC Orders ED Discharge Orders     None

## 2021-08-27 ENCOUNTER — Observation Stay (HOSPITAL_BASED_OUTPATIENT_CLINIC_OR_DEPARTMENT_OTHER): Payer: Commercial Managed Care - PPO | Admitting: Anesthesiology

## 2021-08-27 ENCOUNTER — Observation Stay (HOSPITAL_COMMUNITY): Payer: Commercial Managed Care - PPO | Admitting: Anesthesiology

## 2021-08-27 ENCOUNTER — Encounter (HOSPITAL_COMMUNITY): Payer: Self-pay | Admitting: Urology

## 2021-08-27 ENCOUNTER — Encounter (HOSPITAL_COMMUNITY)
Admission: EM | Disposition: A | Discharge status: Home / Self Care | Payer: Self-pay | Source: Home / Self Care | Attending: Emergency Medicine

## 2021-08-27 ENCOUNTER — Observation Stay (HOSPITAL_COMMUNITY): Payer: Commercial Managed Care - PPO

## 2021-08-27 DIAGNOSIS — F418 Other specified anxiety disorders: Secondary | ICD-10-CM

## 2021-08-27 DIAGNOSIS — N289 Disorder of kidney and ureter, unspecified: Secondary | ICD-10-CM | POA: Diagnosis not present

## 2021-08-27 DIAGNOSIS — N201 Calculus of ureter: Secondary | ICD-10-CM

## 2021-08-27 DIAGNOSIS — F17210 Nicotine dependence, cigarettes, uncomplicated: Secondary | ICD-10-CM

## 2021-08-27 HISTORY — PX: CYSTOSCOPY W/ URETERAL STENT PLACEMENT: SHX1429

## 2021-08-27 LAB — CBC WITH DIFFERENTIAL/PLATELET
Abs Immature Granulocytes: 0.02 10*3/uL (ref 0.00–0.07)
Basophils Absolute: 0 10*3/uL (ref 0.0–0.1)
Basophils Relative: 0 %
Eosinophils Absolute: 0.1 10*3/uL (ref 0.0–0.5)
Eosinophils Relative: 1 %
HCT: 34.9 % — ABNORMAL LOW (ref 39.0–52.0)
Hemoglobin: 11.8 g/dL — ABNORMAL LOW (ref 13.0–17.0)
Immature Granulocytes: 0 %
Lymphocytes Relative: 12 %
Lymphs Abs: 1.1 10*3/uL (ref 0.7–4.0)
MCH: 30.7 pg (ref 26.0–34.0)
MCHC: 33.8 g/dL (ref 30.0–36.0)
MCV: 90.9 fL (ref 80.0–100.0)
Monocytes Absolute: 0.9 10*3/uL (ref 0.1–1.0)
Monocytes Relative: 10 %
Neutro Abs: 6.4 10*3/uL (ref 1.7–7.7)
Neutrophils Relative %: 77 %
Platelets: 221 10*3/uL (ref 150–400)
RBC: 3.84 MIL/uL — ABNORMAL LOW (ref 4.22–5.81)
RDW: 13.2 % (ref 11.5–15.5)
WBC: 8.5 10*3/uL (ref 4.0–10.5)
nRBC: 0 % (ref 0.0–0.2)

## 2021-08-27 LAB — COMPREHENSIVE METABOLIC PANEL
ALT: 17 U/L (ref 0–44)
AST: 18 U/L (ref 15–41)
Albumin: 3.8 g/dL (ref 3.5–5.0)
Alkaline Phosphatase: 48 U/L (ref 38–126)
Anion gap: 7 (ref 5–15)
BUN: 23 mg/dL — ABNORMAL HIGH (ref 6–20)
CO2: 24 mmol/L (ref 22–32)
Calcium: 10.6 mg/dL — ABNORMAL HIGH (ref 8.9–10.3)
Chloride: 105 mmol/L (ref 98–111)
Creatinine, Ser: 2.47 mg/dL — ABNORMAL HIGH (ref 0.61–1.24)
GFR, Estimated: 33 mL/min — ABNORMAL LOW (ref >60)
Glucose, Bld: 95 mg/dL (ref 70–99)
Potassium: 3.9 mmol/L (ref 3.5–5.1)
Sodium: 136 mmol/L (ref 135–145)
Total Bilirubin: 0.5 mg/dL (ref 0.3–1.2)
Total Protein: 6.2 g/dL — ABNORMAL LOW (ref 6.5–8.1)

## 2021-08-27 LAB — URINALYSIS, MICROSCOPIC (REFLEX)

## 2021-08-27 LAB — PROTIME-INR
INR: 1 (ref 0.8–1.2)
Prothrombin Time: 12.9 seconds (ref 11.4–15.2)

## 2021-08-27 LAB — URINALYSIS, ROUTINE W REFLEX MICROSCOPIC
Bilirubin Urine: NEGATIVE
Glucose, UA: NEGATIVE mg/dL
Ketones, ur: NEGATIVE mg/dL
Nitrite: NEGATIVE
Protein, ur: NEGATIVE mg/dL
Specific Gravity, Urine: 1.025 (ref 1.005–1.030)
pH: 5 (ref 5.0–8.0)

## 2021-08-27 LAB — LACTIC ACID, PLASMA: Lactic Acid, Venous: 1 mmol/L (ref 0.5–1.9)

## 2021-08-27 LAB — APTT: aPTT: 29 seconds (ref 24–36)

## 2021-08-27 SURGERY — CYSTOSCOPY, WITH RETROGRADE PYELOGRAM AND URETERAL STENT INSERTION
Anesthesia: General | Site: Ureter | Laterality: Right

## 2021-08-27 MED ORDER — SODIUM CHLORIDE 0.9 % IV SOLN: INTRAVENOUS | Status: DC | PRN

## 2021-08-27 MED ORDER — HYDROMORPHONE HCL 1 MG/ML IJ SOLN
0.5000 mg | INTRAMUSCULAR | Status: DC | PRN
  Administered 2021-08-27 (×2): 0.5 mg via INTRAVENOUS
  Filled 2021-08-27: qty 0.5
  Filled 2021-08-27: qty 1

## 2021-08-27 MED ORDER — PROPOFOL 10 MG/ML IV BOLUS
INTRAVENOUS | Status: DC | PRN
  Administered 2021-08-27: 200 mg via INTRAVENOUS

## 2021-08-27 MED ORDER — MIRABEGRON ER 50 MG PO TB24: 50.0000 mg | ORAL_TABLET | Freq: Every day | ORAL | 0 refills | Status: AC

## 2021-08-27 MED ORDER — SODIUM CHLORIDE 0.45 % IV SOLN: INTRAVENOUS | Status: DC

## 2021-08-27 MED ORDER — LACTATED RINGERS BOLUS PEDS: 1000.0000 mL | Freq: Once | INTRAVENOUS | Status: DC

## 2021-08-27 MED ORDER — LIDOCAINE 2% (20 MG/ML) 5 ML SYRINGE
INTRAMUSCULAR | Status: DC | PRN
  Administered 2021-08-27: 100 mg via INTRAVENOUS

## 2021-08-27 MED ORDER — ONDANSETRON HCL 4 MG/2ML IJ SOLN
INTRAMUSCULAR | Status: DC | PRN
  Administered 2021-08-27: 4 mg via INTRAVENOUS

## 2021-08-27 MED ORDER — ACETAMINOPHEN 10 MG/ML IV SOLN
1000.0000 mg | Freq: Four times a day (QID) | INTRAVENOUS | Status: DC
  Administered 2021-08-27 (×2): 1000 mg via INTRAVENOUS
  Filled 2021-08-27: qty 100

## 2021-08-27 MED ORDER — CIPROFLOXACIN HCL 500 MG PO TABS: 500.0000 mg | ORAL_TABLET | Freq: Once | ORAL | Status: DC

## 2021-08-27 MED ORDER — LACTATED RINGERS IV SOLN: INTRAVENOUS | Status: DC | PRN

## 2021-08-27 MED ORDER — DOCUSATE SODIUM 100 MG PO CAPS: 100.0000 mg | ORAL_CAPSULE | Freq: Two times a day (BID) | ORAL | Status: DC

## 2021-08-27 MED ORDER — AMOXICILLIN-POT CLAVULANATE 500-125 MG PO TABS: 1.0000 | ORAL_TABLET | Freq: Two times a day (BID) | ORAL | 0 refills | Status: AC

## 2021-08-27 MED ORDER — SODIUM CHLORIDE 0.9 % IV SOLN
INTRAVENOUS | Status: DC | PRN
  Administered 2021-08-27: 7 mL

## 2021-08-27 MED ORDER — ACETAMINOPHEN 10 MG/ML IV SOLN
INTRAVENOUS | Status: AC
  Filled 2021-08-27: qty 100

## 2021-08-27 MED ORDER — HYDRALAZINE HCL 20 MG/ML IJ SOLN: 5.0000 mg | INTRAMUSCULAR | Status: DC | PRN

## 2021-08-27 MED ORDER — DEXAMETHASONE SODIUM PHOSPHATE 10 MG/ML IJ SOLN
INTRAMUSCULAR | Status: DC | PRN
  Administered 2021-08-27: 8 mg via INTRAVENOUS

## 2021-08-27 MED ORDER — STERILE WATER FOR IRRIGATION IR SOLN
Status: DC | PRN
  Administered 2021-08-27: 3000 mL

## 2021-08-27 MED ORDER — LACTATED RINGERS IV BOLUS
1000.0000 mL | Freq: Once | INTRAVENOUS | Status: AC
  Administered 2021-08-27: 1000 mL via INTRAVENOUS

## 2021-08-27 MED ORDER — PHENYLEPHRINE 80 MCG/ML (10ML) SYRINGE FOR IV PUSH (FOR BLOOD PRESSURE SUPPORT)
PREFILLED_SYRINGE | INTRAVENOUS | Status: DC | PRN
  Administered 2021-08-27: 80 ug via INTRAVENOUS
  Administered 2021-08-27: 120 ug via INTRAVENOUS
  Administered 2021-08-27: 80 ug via INTRAVENOUS

## 2021-08-27 SURGICAL SUPPLY — 20 items
BAG URO CATCHER STRL LF (MISCELLANEOUS) ×2 IMPLANT
BASKET ZERO TIP NITINOL 2.4FR (BASKET) IMPLANT
CATH URETL OPEN 5X70 (CATHETERS) ×2 IMPLANT
CLOTH BEACON ORANGE TIMEOUT ST (SAFETY) ×2 IMPLANT
EXTRACTOR STONE 1.7FRX115CM (UROLOGICAL SUPPLIES) IMPLANT
GLOVE SURG LX 7.5 STRW (GLOVE) ×1
GLOVE SURG LX STRL 7.5 STRW (GLOVE) ×1 IMPLANT
GOWN STRL REUS W/ TWL LRG LVL3 (GOWN DISPOSABLE) ×2 IMPLANT
GOWN STRL REUS W/ TWL XL LVL3 (GOWN DISPOSABLE) ×1 IMPLANT
GOWN STRL REUS W/TWL LRG LVL3 (GOWN DISPOSABLE) ×4
GOWN STRL REUS W/TWL XL LVL3 (GOWN DISPOSABLE) ×2
GUIDEWIRE ANG ZIPWIRE 038X150 (WIRE) IMPLANT
GUIDEWIRE STR DUAL SENSOR (WIRE) ×2 IMPLANT
MANIFOLD NEPTUNE II (INSTRUMENTS) ×2 IMPLANT
PACK CYSTO (CUSTOM PROCEDURE TRAY) ×2 IMPLANT
SHEATH NAVIGATOR HD 11/13X28 (SHEATH) IMPLANT
SHEATH NAVIGATOR HD 11/13X36 (SHEATH) IMPLANT
STENT URET 6FRX28 CONTOUR (STENTS) ×1 IMPLANT
TUBING CONNECTING 10 (TUBING) ×2 IMPLANT
TUBING UROLOGY SET (TUBING) ×2 IMPLANT

## 2021-08-27 NOTE — Op Note (Signed)
Preoperative diagnosis:  Right proximal ureteral stone with concern for infection  Postoperative diagnosis:  Same  Procedure:  Cystoscopy right ureteral stent placement right retrograde pyelography with interpretation   Surgeon: [name], MD  Anesthesia: General  Complications: None  Intraoperative findings:   #1: The patient's right retrograde pyelogram demonstrated a normal distal and mid ureter without evidence of filling defects.  However, in the proximal ureter there was high-grade obstruction with a filling defect and some minimal contrast getting beyond that area.  The proximal ureter was also torturous. #2: Initially I was unable to advance the wire past the stone indicating a high-grade obstruction/impacted stone.  I was ultimately able to get this wire passed the stone and then advanced the open-ended catheter beyond that.  I aspirated some fluid from the renal pelvis and then injected another 5 cc of contrast to ensure that I was in the collecting system.  He was noted to have quite significant hydronephrosis. #3: A 28 cm time 6 French double-J stent was placed, the proximal curl of the stent curl noted at the renal pelvis  EBL: Minimal  Specimens: None  Indication: Gregory Holmes is a 41 y.o. patient with 7 mm right proximal ureteral stone, fever, and acute renal failure. After reviewing the management options for treatment, he elected to proceed with the above surgical procedure(s). We have discussed the potential benefits and risks of the procedure, side effects of the proposed treatment, the likelihood of the patient achieving the goals of the procedure, and any potential problems that might occur during the procedure or recuperation. Informed consent has been obtained.  Description of procedure:  The patient was taken to the operating room and general anesthesia was induced.  The patient was placed in the dorsal lithotomy position, prepped and draped in the  usual sterile fashion, and preoperative antibiotics were administered. A preoperative time-out was performed.   Cystourethroscopy was performed.  The patient's urethra was examined and minimal prostatic hypertrophy. The bladder was then systematically examined in its entirety. There was no evidence for any bladder tumors, stones, or other mucosal pathology.    Attention then turned to the rightureteral orifice and a ureteral catheter was used to intubate the ureteral orifice.  Omnipaque contrast was injected through the ureteral catheter and a retrograde pyelogram was performed with findings as dictated above.  A 0.38 sensor guidewire was then advanced up the right ureter into the renal pelvis under fluoroscopic guidance.  The wire was then backloaded through the cystoscope and a ureteral stent was advance over the wire using Seldinger technique.  The stent was positioned appropriately under fluoroscopic and cystoscopic guidance.  The wire was then removed with an adequate stent curl noted in the renal pelvis as well as in the bladder.  The bladder was then emptied and the procedure ended.  The patient appeared to tolerate the procedure well and without complications.  The patient was able to be awakened and transferred to the recovery unit in satisfactory condition.    [name], M.D.

## 2021-08-27 NOTE — Anesthesia Preprocedure Evaluation (Addendum)
Anesthesia Evaluation  Patient identified by MRN, date of birth, ID band Patient awake    Reviewed: Allergy & Precautions, NPO status , Patient's Chart, lab work & pertinent test results  Airway Mallampati: I  TM Distance: >3 FB Neck ROM: Full    Dental  (+) Teeth Intact, Dental Advisory Given   Pulmonary Current Smoker,    breath sounds clear to auscultation       Cardiovascular negative cardio ROS   Rhythm:Regular Rate:Normal     Neuro/Psych PSYCHIATRIC DISORDERS Anxiety Depression    GI/Hepatic Neg liver ROS, GERD  ,  Endo/Other  negative endocrine ROS  Renal/GU Renal disease     Musculoskeletal negative musculoskeletal ROS (+)   Abdominal Normal abdominal exam  (+)   Peds  Hematology negative hematology ROS (+)   Anesthesia Other Findings   Reproductive/Obstetrics                            Anesthesia Physical Anesthesia Plan  ASA: 3  Anesthesia Plan: General   Post-op Pain Management:    Induction: Intravenous  PONV Risk Score and Plan: 2 and Ondansetron, Dexamethasone and Midazolam  Airway Management Planned: LMA  Additional Equipment: None  Intra-op Plan:   Post-operative Plan: Extubation in OR  Informed Consent: I have reviewed the patients History and Physical, chart, labs and discussed the procedure including the risks, benefits and alternatives for the proposed anesthesia with the patient or authorized representative who has indicated his/her understanding and acceptance.     Dental advisory given  Plan Discussed with: CRNA  Anesthesia Plan Comments:        Anesthesia Quick Evaluation

## 2021-08-27 NOTE — Progress Notes (Signed)
Dx with right ureteral stone on 7/28, on ciprofloxicin.  Developed low grade fevers at home.  Represented to ED based on advice from outside Urologist.  Noted to have acute kidney injury and fever of 100.1 in ED.  Plan to admit for observation.  Will place stent in the morning.

## 2021-08-27 NOTE — Discharge Instructions (Signed)
DISCHARGE INSTRUCTIONS FOR KIDNEY STONE/URETERAL STENT   MEDICATIONS:  1.  Resume all your other meds from home.  2.  Avoid NSAIDs for the next several days, take Tylenol for mild to moderate pain, and the narcotic prescribed previously for breakthrough pain. 3.  Transition from ciprofloxacin to Augmentin for 7 days.  ACTIVITY:  1. No strenuous activity x 1week  2. No driving while on narcotic pain medications  3. Drink plenty of water  4. Continue to walk at home - you can still get blood clots when you are at home, so keep active, but don't over do it.  5. May return to work/school tomorrow or when you feel ready   BATHING:  1. You can shower and we recommend daily showers   SIGNS/SYMPTOMS TO CALL:  Please call us if you have a fever greater than 101.5, uncontrolled nausea/vomiting, uncontrolled pain, dizziness, unable to urinate, bloody urine, chest pain, shortness of breath, leg swelling, leg pain, redness around wound, drainage from wound, or any other concerns or questions.   You can reach [place] at [phone].   FOLLOW-UP:  1.  Discussed treatment with Dr. Nila Nephew, if interested in having treatment for the removal of the stone in [facility], please contact Dr. Louis Meckel to set up the procedure.

## 2021-08-27 NOTE — Plan of Care (Signed)
  Problem: Education: Goal: Knowledge of General Education information will improve Description: Including pain rating scale, medication(s)/side effects and non-pharmacologic comfort measures Outcome: Progressing   Problem: Safety: Goal: Ability to remain free from injury will improve Outcome: Progressing   

## 2021-08-27 NOTE — Transfer of Care (Signed)
Immediate Anesthesia Transfer of Care Note  Patient: Gregory Holmes  Procedure(s) Performed: CYSTOSCOPY WITH RETROGRADE PYELOGRAM/URETERAL STENT PLACEMENT (Right: Ureter)  Patient Location: PACU  Anesthesia Type:General  Level of Consciousness: drowsy  Airway & Oxygen Therapy: Patient Spontanous Breathing and Patient connected to face mask oxygen  Post-op Assessment: Report given to RN and Post -op Vital signs reviewed and stable  Post vital signs: Reviewed and stable  Last Vitals:  Vitals Value Taken Time  BP 108/67 08/27/21 1307  Temp 36.4 C 08/27/21 1307  Pulse 86 08/27/21 1310  Resp 15 08/27/21 1310  SpO2 98 % 08/27/21 1310  Vitals shown include unvalidated device data.  Last Pain:  Vitals:   08/27/21 0852  TempSrc: Oral  PainSc:       Patients Stated Pain Goal: 2 (32/54/98 2641)  Complications: No notable events documented.

## 2021-08-27 NOTE — Anesthesia Procedure Notes (Signed)
Procedure Name: LMA Insertion Date/Time: 08/27/2021 12:33 PM  Performed by: Milford Cage, CRNAPre-anesthesia Checklist: Patient identified, Emergency Drugs available, Suction available and Patient being monitored Patient Re-evaluated:Patient Re-evaluated prior to induction Oxygen Delivery Method: Circle system utilized Preoxygenation: Pre-oxygenation with 100% oxygen Induction Type: IV induction Ventilation: Mask ventilation without difficulty LMA: LMA inserted LMA Size: 5.0 Number of attempts: 1 Tube secured with: Tape Dental Injury: Teeth and Oropharynx as per pre-operative assessment

## 2021-08-27 NOTE — Anesthesia Postprocedure Evaluation (Signed)
Anesthesia Post Note  Patient: Gregory Holmes  Procedure(s) Performed: CYSTOSCOPY WITH RETROGRADE PYELOGRAM/URETERAL STENT PLACEMENT (Right: Ureter)     Patient location during evaluation: PACU Anesthesia Type: General Level of consciousness: awake and alert Pain management: pain level controlled Vital Signs Assessment: post-procedure vital signs reviewed and stable Respiratory status: spontaneous breathing, nonlabored ventilation, respiratory function stable and patient connected to nasal cannula oxygen Cardiovascular status: blood pressure returned to baseline and stable Postop Assessment: no apparent nausea or vomiting Anesthetic complications: no   No notable events documented.  Last Vitals:  Vitals:   08/27/21 1328 08/27/21 1330  BP: 119/77 119/77  Pulse: 77 82  Resp: 19 19  Temp:  36.4 C  SpO2: 97% 99%    Last Pain:  Vitals:   08/27/21 1328  TempSrc:   PainSc: 0-No pain                 Effie Berkshire

## 2021-08-27 NOTE — ED Notes (Signed)
ED TO INPATIENT HANDOFF REPORT  ED Nurse Name and Phone #: April Holding [phone]  S Name/Age/Gender Gregory Holmes 41 y.o. male Room/Bed: MH01/MH01  Code Status   Code Status: Not on file  Home/SNF/Other Home Patient oriented to: self, place, time, and situation Is this baseline? Yes   Triage Complete: Triage complete  Chief Complaint Ureteral stone [N20.1]  Triage Note Pt seen here yesterday and dx with renal calculus. Pt returned due to temp at home of 99.9, and states his urologist was concerned for sepsis.   Allergies No Known Allergies  Level of Care/Admitting Diagnosis ED Disposition     ED Disposition  Admit   Condition  --   Comment  Hospital Area: [facility] [[id number]]  Level of Care: Med-Surg [16]  Interfacility transfer: Yes  May place patient in observation at [facility] or [facility] if equivalent level of care is available:: No  Covid Evaluation: Asymptomatic - no recent exposure (last 10 days) testing not required  Diagnosis: Ureteral stone [332951]  Admitting Physician: [name] [[id number]]  Attending Physician: [name] [[id number]]          B Medical/Surgery History Past Medical History:  Diagnosis Date   GERD (gastroesophageal reflux disease)    Hodgkin's lymphoma ([place])    Renal disorder    kidney stones   Seasonal allergies    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[place])    Past Surgical History:  Procedure Laterality Date   LIMBAL STEM CELL TRANSPLANT       A IV Location/Drains/Wounds Patient Lines/Drains/Airways Status     Active Line/Drains/Airways     Name Placement date Placement time Site Days   Peripheral IV 08/27/21 20 G Anterior;Distal;Right;Upper Arm 08/27/21  0020  Arm  less than 1            Intake/Output Last 24 hours  Intake/Output Summary (Last 24 hours) at 08/27/2021 0224 Last data filed at 08/27/2021 0125 Gross per 24 hour  Intake 1000 ml  Output --  Net 1000  ml    Labs/Imaging Results for orders placed or performed during the hospital encounter of 08/26/21 (from the past 48 hour(s))  Lactic acid, plasma     Status: None   Collection Time: 08/27/21 12:09 AM  Result Value Ref Range   Lactic Acid, Venous 1.0 0.5 - 1.9 mmol/L    Comment: Performed at [facility], [address] [place], [place] [address]  Comprehensive metabolic panel     Status: Abnormal   Collection Time: 08/27/21 12:09 AM  Result Value Ref Range   Sodium 136 135 - 145 mmol/L   Potassium 3.9 3.5 - 5.1 mmol/L   Chloride 105 98 - 111 mmol/L   CO2 24 22 - 32 mmol/L   Glucose, Bld 95 70 - 99 mg/dL    Comment: Glucose reference range applies only to samples taken after fasting for at least 8 hours.   BUN 23 (H) 6 - 20 mg/dL   Creatinine, Ser 2.47 (H) 0.61 - 1.24 mg/dL   Calcium 10.6 (H) 8.9 - 10.3 mg/dL   Total Protein 6.2 (L) 6.5 - 8.1 g/dL   Albumin 3.8 3.5 - 5.0 g/dL   AST 18 15 - 41 U/L   ALT 17 0 - 44 U/L   Alkaline Phosphatase 48 38 - 126 U/L   Total Bilirubin 0.5 0.3 - 1.2 mg/dL   GFR, Estimated 33 (L) >60 mL/min    Comment: (NOTE) Calculated  using the CKD-EPI Creatinine Equation (2021)    Anion gap 7 5 - 15    Comment: Performed at [facility], [address] [place], [place] [address]  CBC with Differential     Status: Abnormal   Collection Time: 08/27/21 12:09 AM  Result Value Ref Range   WBC 8.5 4.0 - 10.5 K/uL   RBC 3.84 (L) 4.22 - 5.81 MIL/uL   Hemoglobin 11.8 (L) 13.0 - 17.0 g/dL   HCT 34.9 (L) 39.0 - 52.0 %   MCV 90.9 80.0 - 100.0 fL   MCH 30.7 26.0 - 34.0 pg   MCHC 33.8 30.0 - 36.0 g/dL   RDW 13.2 11.5 - 15.5 %   Platelets 221 150 - 400 K/uL   nRBC 0.0 0.0 - 0.2 %   Neutrophils Relative % 77 %   Neutro Abs 6.4 1.7 - 7.7 K/uL   Lymphocytes Relative 12 %   Lymphs Abs 1.1 0.7 - 4.0 K/uL   Monocytes Relative 10 %   Monocytes Absolute 0.9 0.1 - 1.0 K/uL   Eosinophils Relative 1 %   Eosinophils Absolute 0.1 0.0 - 0.5 K/uL    Basophils Relative 0 %   Basophils Absolute 0.0 0.0 - 0.1 K/uL   Immature Granulocytes 0 %   Abs Immature Granulocytes 0.02 0.00 - 0.07 K/uL    Comment: Performed at [facility], [address] [place], [place] [address]  Protime-INR     Status: None   Collection Time: 08/27/21 12:09 AM  Result Value Ref Range   Prothrombin Time 12.9 11.4 - 15.2 seconds   INR 1.0 0.8 - 1.2    Comment: (NOTE) INR goal varies based on device and disease states. Performed at [facility], [address] [place], [place] [address]   APTT     Status: None   Collection Time: 08/27/21 12:09 AM  Result Value Ref Range   aPTT 29 24 - 36 seconds    Comment: Performed at [facility], [address] [place], [place] [address]  Urinalysis, Routine w reflex microscopic     Status: Abnormal   Collection Time: 08/27/21 12:09 AM  Result Value Ref Range   Color, Urine YELLOW YELLOW   APPearance CLEAR CLEAR   Specific Gravity, Urine 1.025 1.005 - 1.030   pH 5.0 5.0 - 8.0   Glucose, UA NEGATIVE NEGATIVE mg/dL   Hgb urine dipstick MODERATE (A) NEGATIVE   Bilirubin Urine NEGATIVE NEGATIVE   Ketones, ur NEGATIVE NEGATIVE mg/dL   Protein, ur NEGATIVE NEGATIVE mg/dL   Nitrite NEGATIVE NEGATIVE   Leukocytes,Ua TRACE (A) NEGATIVE    Comment: Performed at [facility], [address] [place], [place] [address]  Urinalysis, Microscopic (reflex)     Status: Abnormal   Collection Time: 08/27/21 12:09 AM  Result Value Ref Range   RBC / HPF 0-5 0 - 5 RBC/hpf   WBC, UA 6-10 0 - 5 WBC/hpf   Bacteria, UA RARE (A) NONE SEEN   Squamous Epithelial / LPF 0-5 0 - 5   Mucus PRESENT    Ca Oxalate Crys, UA PRESENT     Comment: Performed at [facility], [address] [place], [place] [address]   DG Chest Port 1 View  Result Date: 08/27/2021 CLINICAL DATA:  Question of sepsis. Recent diagnosis of renal calculus. Fever. EXAM: PORTABLE CHEST 1 VIEW COMPARISON:  CT  12/20/2007 FINDINGS: The  heart size and mediastinal contours are within normal limits. Both lungs are clear. The visualized skeletal structures are unremarkable. IMPRESSION: No active disease. Electronically Signed   By: Lucienne Capers M.D.   On: 08/27/2021 00:17    Pending Labs Unresulted Labs (From admission, onward)     Start     Ordered   08/26/21 2349  Lactic acid, plasma  (Undifferentiated presentation (screening labs and basic nursing orders))  STAT Now then every 2 hours,   R      08/26/21 2349   08/26/21 2349  Blood Culture (routine x 2)  (Undifferentiated presentation (screening labs and basic nursing orders))  BLOOD CULTURE X 2,   STAT      08/26/21 2349   08/26/21 2349  Urine Culture  (Undifferentiated presentation (screening labs and basic nursing orders))  ONCE - URGENT,   URGENT       Question:  Indication  Answer:  Flank Pain   08/26/21 2349            Vitals/Pain Today's Vitals   08/26/21 2332 08/26/21 2333 08/27/21 0110 08/27/21 0218  BP: 132/83   135/83  Pulse: 97   81  Resp: 17   14  Temp: 100.1 F (37.8 C)   99.3 F (37.4 C)  TempSrc: Oral   Oral  SpO2: 96%   100%  Weight:  90.7 kg    Height:  '6\' 1"'$  (1.854 m)    PainSc:  7  3      Isolation Precautions No active isolations  Medications Medications  0.9 %  sodium chloride infusion ( Intravenous New Bag/Given 08/27/21 0130)  acetaminophen (OFIRMEV) IV 1,000 mg (has no administration in time range)  0.45 % sodium chloride infusion (has no administration in time range)  ciprofloxacin (CIPRO) tablet 500 mg (has no administration in time range)  hydrALAZINE (APRESOLINE) injection 5 mg (has no administration in time range)  HYDROmorphone (DILAUDID) injection 0.5 mg (has no administration in time range)  docusate sodium (COLACE) capsule 100 mg (has no administration in time range)  lactated ringers bolus 1,000 mL (0 mLs Intravenous Stopped 08/27/21 0125)  HYDROmorphone (DILAUDID) injection 1 mg (1 mg  Intravenous Given 08/27/21 0024)  acetaminophen (TYLENOL) tablet 650 mg (650 mg Oral Given 08/27/21 0033)  cefTRIAXone (ROCEPHIN) 1 g in sodium chloride 0.9 % 100 mL IVPB (1 g Intravenous New Bag/Given 08/27/21 0132)  ondansetron (ZOFRAN) injection 4 mg (4 mg Intravenous Given 08/27/21 0022)  lactated ringers bolus 1,000 mL (1,000 mLs Intravenous New Bag/Given 08/27/21 0210)    Mobility walks Low fall risk   Focused Assessments NA   R Recommendations: See Admitting Provider Note  Report given to:   Additional Notes: PT seen for stones possible septic. Coming to you POV, Family at bedside.

## 2021-08-27 NOTE — Progress Notes (Addendum)
Patient arrived on direct admission. Alert and oriented, Not in distress. Ambulatory. Transferred to bed safely. [facility] notified.

## 2021-08-27 NOTE — Discharge Summary (Signed)
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 Briefly, Gregory Holmes is a 41 y.o. year old patient with fever, acute renal failure and obstructing right ureteral stone.   Hospital Course: The patient was admitted from the urgent care facility and monitored overnight.  On hospital day #1 he was taken to the operating room.  Patient tolerated the procedure well.  He was then transferred to the floor after an uneventful PACU stay.  His hospital course was uncomplicated.  On POD#0 he had met discharge criteria: was eating a regular diet, was up and ambulating independently,  pain was well controlled, was voiding without a catheter, and was ready to for discharge.  The patient had been afebrile since his initial fever in the emergency department.  The patient is being transitioned from ciprofloxacin to Augmentin for 7 days.  In addition, I stopped his NSAIDs and encourage fluid intake.   Laboratory values:  Recent Labs    08/25/21 0111 08/27/21 0009  WBC 10.0 8.5  HGB 12.9* 11.8*  HCT 38.0* 34.9*   Recent Labs    08/25/21 0111 08/27/21 0009  NA 135 136  K 3.8 3.9  CL 106 105  CO2 22 24  GLUCOSE 104* 95  BUN 22* 23*  CREATININE 1.66* 2.47*  CALCIUM 10.4* 10.6*   Recent Labs    08/27/21 0009   INR 1.0   No results for input(s): "LABURIN" in the last 72 hours. Results for orders placed or performed in visit on 07/31/17  GC/Chlamydia Probe Amp([facility])     Status: None   Collection Time: 07/31/17 10:05 AM   Specimen: Urine   URINE  Result Value Ref Range Status   Chlamydia trachomatis, NAA Negative Negative Final   Neisseria gonorrhoeae by PCR Negative Negative Final    Disposition: Home  Discharge instruction: The patient was instructed to be ambulatory but told to refrain from heavy lifting, strenuous activity, or driving.   Discharge medications:  Allergies as of 08/27/2021   No Known Allergies      Medication List     STOP taking these medications    ciprofloxacin 500 MG tablet Commonly known as: CIPRO   ibuprofen 200 MG tablet Commonly known as: ADVIL       TAKE these medications    amoxicillin-clavulanate 500-125 MG tablet Commonly known as: Augmentin Take 1 tablet (500 mg total) by mouth 2 (two) times daily.   buPROPion 150 MG 24 hr tablet Commonly known as: WELLBUTRIN XL Take 1 tablet (150 mg total) by mouth daily. What changed: when to take this   famotidine 10 MG tablet Commonly known as: PEPCID Take 10 mg by mouth at bedtime.   fexofenadine 180 MG tablet Commonly known as: ALLEGRA Take 180 mg by mouth at bedtime.   fluticasone 50 MCG/ACT nasal spray Commonly known as: FLONASE Place 2 sprays into both nostrils daily. What changed:  when to take this reasons to take this   multivitamin with minerals Tabs tablet Take 1 tablet by mouth daily. Centrum   ondansetron 8 MG disintegrating tablet Commonly known as: ZOFRAN-ODT Take 8 mg by mouth every 8 (eight) hours as needed for nausea or vomiting.   oxyCODONE-acetaminophen 5-325 MG tablet Commonly known as: Percocet Take 1-2 tablets by mouth every 6 (six) hours as needed. What changed: reasons to take this   tadalafil 5 MG tablet Commonly known as: CIALIS Take 5 mg by mouth at  bedtime.   tamsulosin 0.4 MG Caps capsule Commonly known as: FLOMAX Take 0.4 mg by mouth every morning.        Followup:   Follow-up Information     [name], MD Follow up.   Specialty: Urology Contact information: [name] [address] [phone]         [name], MD Follow up.   Specialty: Urology Contact information: [place] [address] [phone]

## 2021-08-27 NOTE — H&P (Signed)
Cc: right obstructing stone, fever, ARF  History of present illness: 41 year old male with a history of a 7 mm right proximal ureteral stone, first diagnosed on 08/25/2021 at urgent care.  He was discharged home with conservative measures and even started on ciprofloxacin by Dr. Nila Nephew in [place], who he has a close relationship with.  Despite being on pain medication and ciprofloxacin the patient developed progression of his pain and developed low-grade fevers.  He was febrile to 99.5 and then was found to be febrile to 100.1 in the emergency department.  In addition, the patient was noted to have acute kidney injury.  He was transferred from [facility] to the urology service for further evaluation and management.  The patient this morning is comfortable, afebrile with stable vital signs.  He has been n.p.o. since midnight.  Review of systems: A 12 point comprehensive review of systems was obtained and is negative unless otherwise stated in the history of present illness.  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   Current Outpatient Medications on File Prior to Encounter  Medication Sig Dispense Refill   buPROPion (WELLBUTRIN XL) 150 MG 24 hr tablet TAKE 1 TABLET BY MOUTH ONCE EVERY 24 HOURS 30 tablet 5   buPROPion (WELLBUTRIN XL) 150 MG 24 hr tablet TAKE ONE TABLET BY MOUTH DAILY 30 tablet 0   buPROPion (WELLBUTRIN XL) 150 MG 24 hr tablet Take 1 tablet (150 mg total) by mouth daily. 30 tablet 5   Cholecalciferol (VITAMIN D-1000 MAX ST) 25 MCG (1000 UT) tablet Take by mouth.      clotrimazole-betamethasone (LOTRISONE) cream      escitalopram (LEXAPRO) 20 MG tablet Take 1 tablet (20 mg total) by mouth daily. 90 tablet 1   famotidine (PEPCID) 20 MG tablet Take 1 tablet (20 mg total) by mouth 2 (two) times daily. 60 tablet 3   fluticasone (FLONASE) 50 MCG/ACT nasal spray Place 2 sprays into both nostrils daily. 16 g 6   levocetirizine (XYZAL) 5 MG tablet Take 1 tablet (5 mg total) by mouth every evening. (Patient not taking: Reported on 09/04/2018) 30 tablet 6   oxyCODONE-acetaminophen (PERCOCET) 5-325 MG tablet Take 1-2 tablets by mouth every 6 (six) hours as needed. 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[place])     Past Surgical History:  Procedure Laterality Date   LIMBAL STEM CELL TRANSPLANT      Social History   Tobacco Use   Smoking status: Every Day    Packs/day: 1.00    Types: Cigarettes   Smokeless tobacco: Never  Vaping Use   Vaping Use:  Never used  Substance Use Topics   Alcohol use: Yes    Comment: occasional   Drug use: Never    Family History  Problem Relation Age of Onset   Diabetes Mother     PE: Vitals:   08/26/21 2333 08/27/21 0218 08/27/21 0445 08/27/21 0852  BP:  135/83 (!) 143/84 118/75  Pulse:  81 82 69  Resp:  '14 18 18  '$ Temp:  99.3 F (37.4 C) 98.5 F (36.9 C) 98.5 F (36.9 C)  TempSrc:  Oral Oral Oral  SpO2:  100% 98% 97%  Weight: 90.7 kg     Height: '6\' 1"'$  (1.854 m)      Patient appears to be in no acute distress  patient is alert and oriented x3 Atraumatic normocephalic head No cervical or supraclavicular lymphadenopathy  appreciated No increased work of breathing, no audible wheezes/rhonchi Regular sinus rhythm/rate Abdomen is soft, nontender, nondistended, moderate right CVA tenderness Lower extremities are symmetric without appreciable edema Grossly neurologically intact No identifiable skin lesions  Recent Labs    08/25/21 0111 08/27/21 0009  WBC 10.0 8.5  HGB 12.9* 11.8*  HCT 38.0* 34.9*   Recent Labs    08/25/21 0111 08/27/21 0009  NA 135 136  K 3.8 3.9  CL 106 105  CO2 22 24  GLUCOSE 104* 95  BUN 22* 23*  CREATININE 1.66* 2.47*  CALCIUM 10.4* 10.6*   Recent Labs    08/27/21 0009  INR 1.0   No results for input(s): "LABURIN" in the last 72 hours. Results for orders placed or performed in visit on 07/31/17  GC/Chlamydia Probe Amp([facility])     Status: None   Collection Time: 07/31/17 10:05 AM   Specimen: Urine   URINE  Result Value Ref Range Status   Chlamydia trachomatis, NAA Negative Negative Final   Neisseria gonorrhoeae by PCR Negative Negative Final    Imaging: I have independently reviewed the patient's CT scan from 08/25/2021 which demonstrates a 7 mm right proximal ureteral stone.  There are no additional stones.  The patient has proximal hydronephrosis on the right.  Imp/Recommendations: The patient has right proximal ureteral obstructing stone with acute kidney injury and low-grade fevers despite being on ciprofloxacin.  Given this I recommended we place a stent, allow for defervesced since and return of normal kidney function and then proceed with stone extraction at a later date.  The patient will likely be able to be discharged home.    [name]

## 2021-08-28 ENCOUNTER — Encounter (HOSPITAL_COMMUNITY): Payer: Self-pay | Admitting: Urology

## 2021-08-28 LAB — URINE CULTURE: Culture: NO GROWTH

## 2021-09-01 LAB — CULTURE, BLOOD (ROUTINE X 2)
Culture: NO GROWTH
Culture: NO GROWTH
Special Requests: ADEQUATE
Special Requests: ADEQUATE

## 2022-12-19 IMAGING — DX DG ABDOMEN 1V
3 series · 3 of 3 positions shown · non-contrast
Comparison: 08/04/2020.  CT, 07/27/2020.

CLINICAL DATA: History of a kidney stone 2 weeks ago. No current
pain.

EXAM:
ABDOMEN - 1 VIEW

[abdomen kub (1 of 3)]
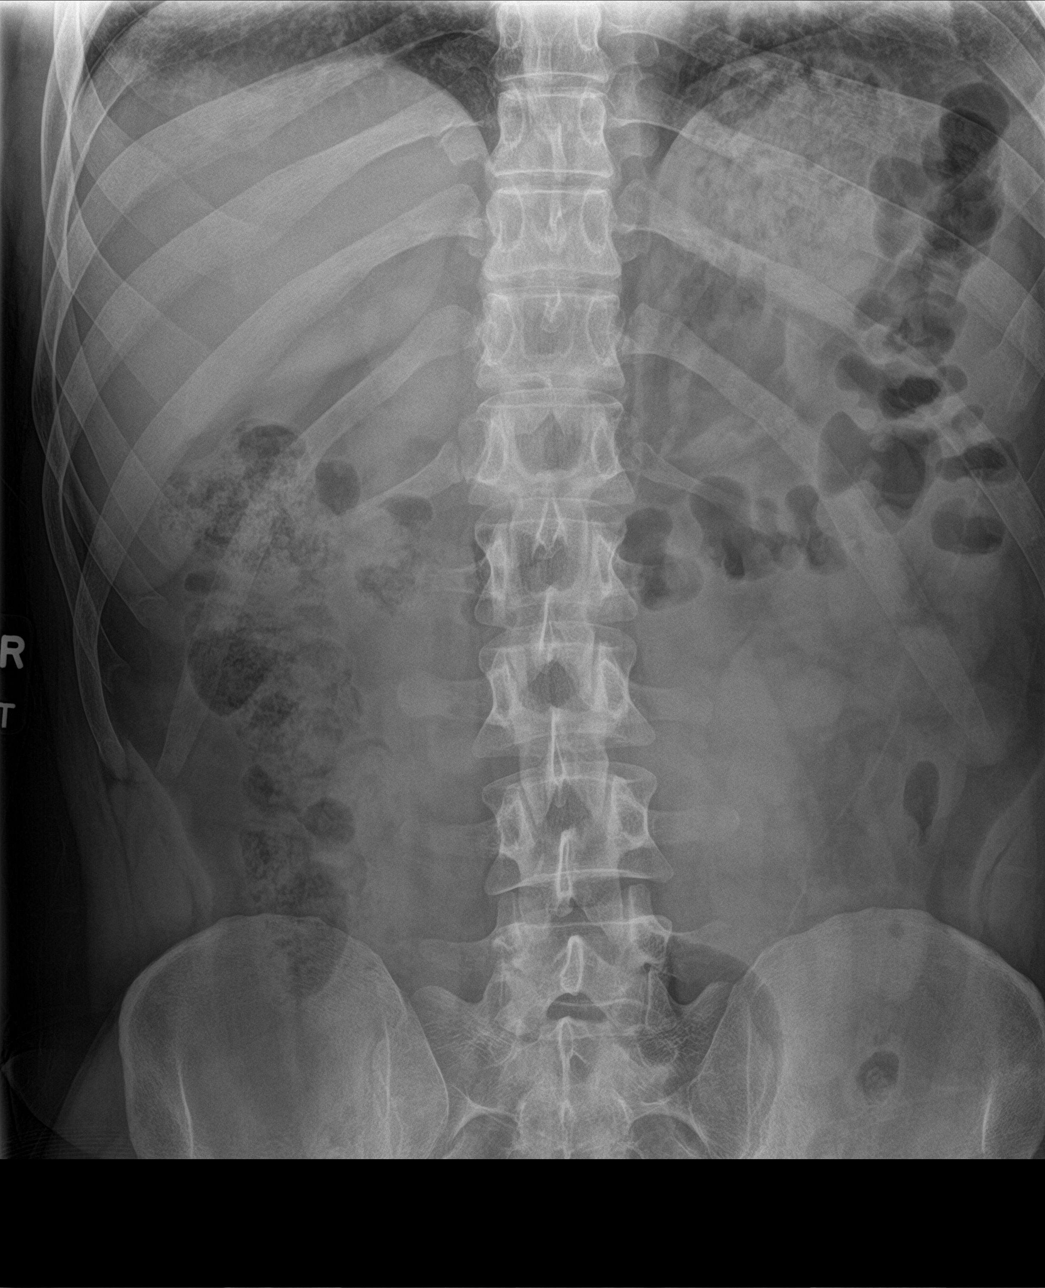

[abdomen kub (2 of 3)]
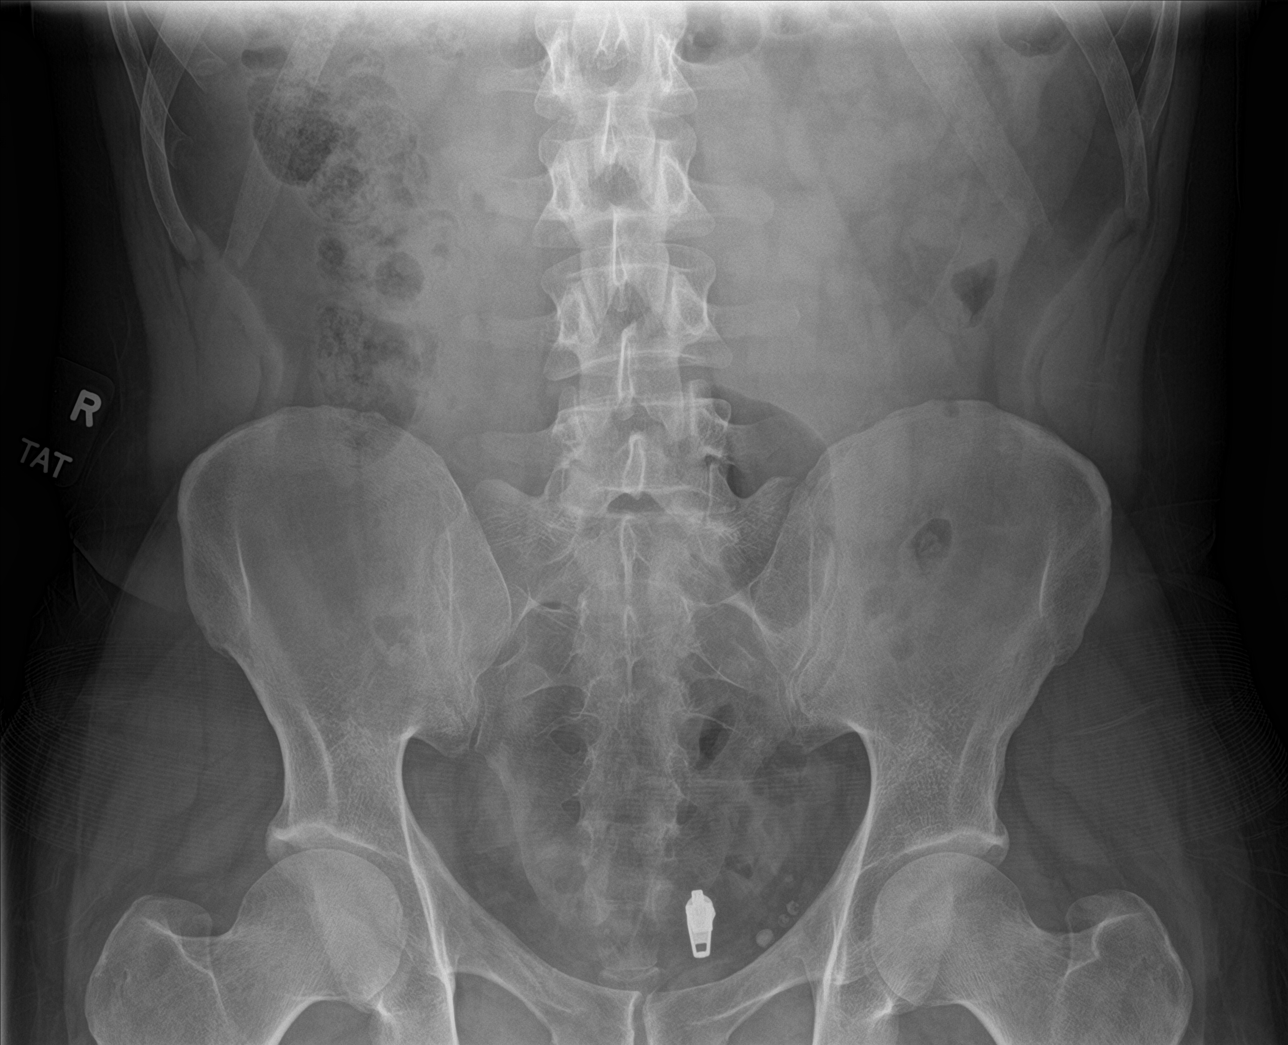

[abdomen kub (3 of 3)]
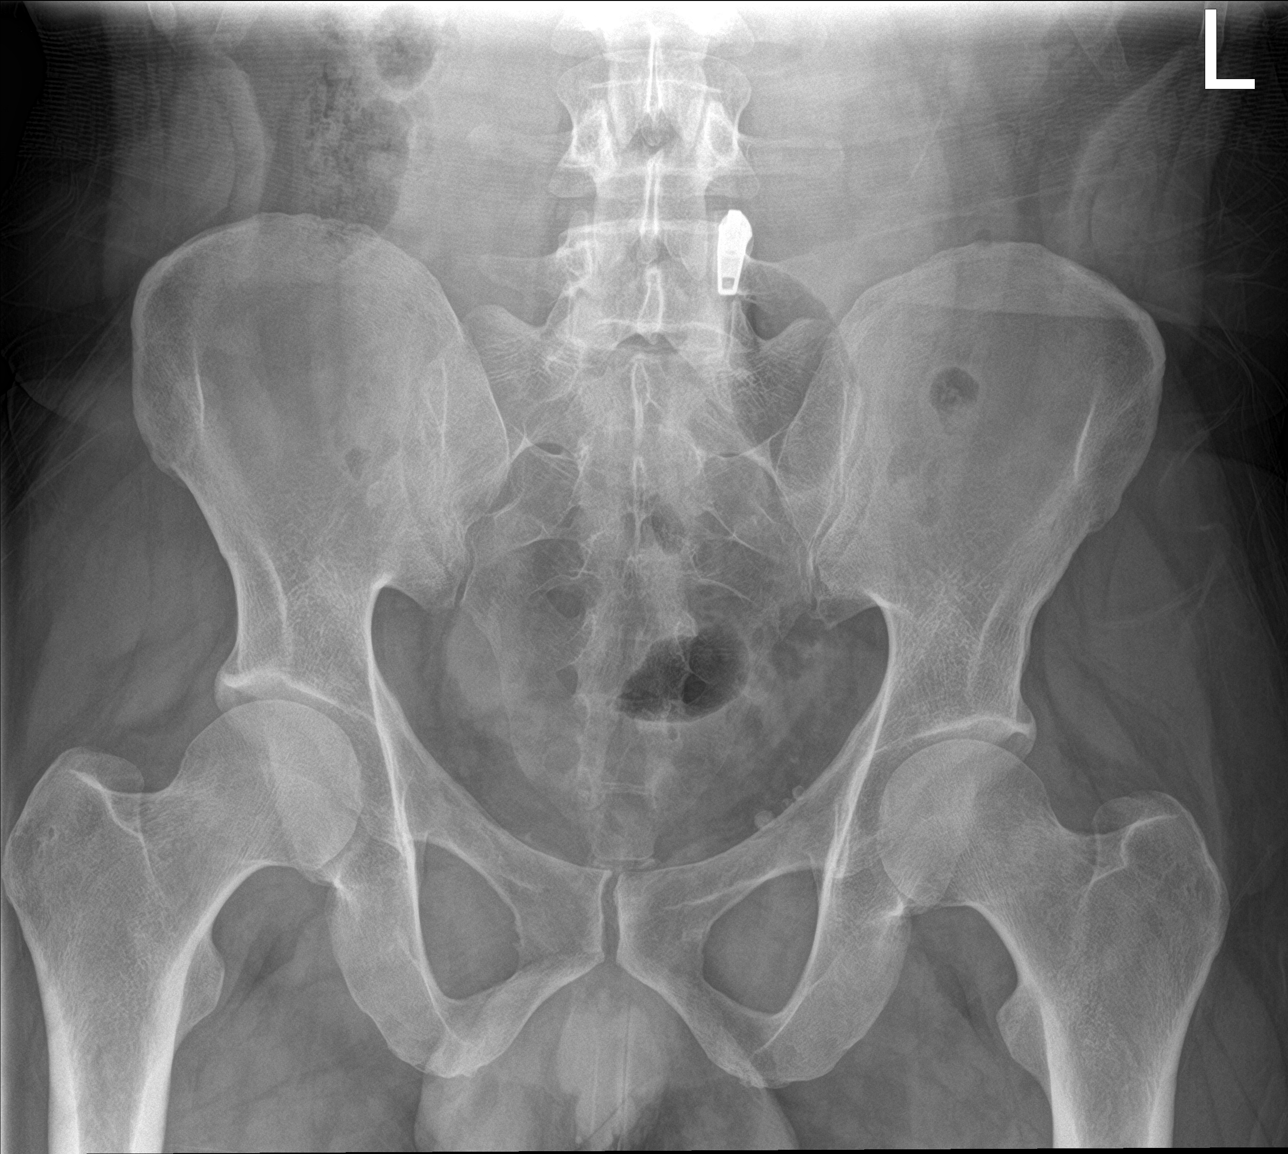

[3 of 3 positions shown; findings below may reference images not displayed]

FINDINGS: Small stone noted in the proximal ureter the prior exams, best seen
on the CT, is no longer visualized. There is no current evidence of
a renal or ureteral stone.

Soft tissues are unremarkable. Normal bowel gas pattern. No skeletal
abnormality.
IMPRESSION: 1. Previously seen right ureteral stone is no longer evident.
